# Patient Record
Sex: Female | Born: 1965 | Race: White | Hispanic: No | Marital: Married | Smoking: Current every day smoker
Health system: Southern US, Community
[De-identification: ages and names within clinical notes are randomized; demographics above are authoritative.]

## PROBLEM LIST (undated history)

## (undated) DIAGNOSIS — D649 Anemia, unspecified: Secondary | ICD-10-CM

## (undated) HISTORY — PX: COLONOSCOPY: SHX174

## (undated) HISTORY — DX: Anemia, unspecified: D64.9

---

## 2008-05-22 ENCOUNTER — Encounter (INDEPENDENT_AMBULATORY_CARE_PROVIDER_SITE_OTHER): Payer: Self-pay | Admitting: *Deleted

## 2009-04-18 ENCOUNTER — Encounter (INDEPENDENT_AMBULATORY_CARE_PROVIDER_SITE_OTHER): Payer: Self-pay | Admitting: *Deleted

## 2009-04-18 ENCOUNTER — Telehealth: Payer: Self-pay | Admitting: Internal Medicine

## 2009-05-15 ENCOUNTER — Encounter (INDEPENDENT_AMBULATORY_CARE_PROVIDER_SITE_OTHER): Payer: Self-pay | Admitting: *Deleted

## 2009-05-16 ENCOUNTER — Ambulatory Visit: Payer: Self-pay | Admitting: Internal Medicine

## 2009-05-28 ENCOUNTER — Ambulatory Visit: Payer: Self-pay | Admitting: Internal Medicine

## 2010-04-01 NOTE — Letter (Signed)
Summary: [facility] Instructions  [facility]  [address] [place], [place] [address]   Phone: [phone]  Fax: [phone]       Allison Carpenter    06/24/1965    MRN: [id number]        Procedure Day /Date:  Tuesday 05/28/2009     Arrival Time:  1:30 pm      Procedure Time: 2:30 pm     Location of Procedure:                    _ x_  [facility] (4th Floor)                        PREPARATION FOR COLONOSCOPY WITH MOVIPREP   Starting 5 days prior to your procedure Thursday 3/24 do not eat nuts, seeds, popcorn, corn, beans, peas,  salads, or any raw vegetables.  Do not take any fiber supplements (e.g. Metamucil, Citrucel, and Benefiber).  THE DAY BEFORE YOUR PROCEDURE         DATE: Monday 3/28  1.  Drink clear liquids the entire day-NO SOLID FOOD  2.  Do not drink anything colored red or purple.  Avoid juices with pulp.  No orange juice.  3.  Drink at least 64 oz. (8 glasses) of fluid/clear liquids during the day to prevent dehydration and help the prep work efficiently.  CLEAR LIQUIDS INCLUDE: Water Jello Ice Popsicles Tea (sugar ok, no milk/cream) Powdered fruit flavored drinks Coffee (sugar ok, no milk/cream) Gatorade Juice: apple, white grape, white cranberry  Lemonade Clear bullion, consomm, broth Carbonated beverages (any kind) Strained chicken noodle soup Hard Candy                             4.  In the morning, mix first dose of MoviPrep solution:    Empty 1 Pouch A and 1 Pouch B into the disposable container    Add lukewarm drinking water to the top line of the container. Mix to dissolve    Refrigerate (mixed solution should be used within 24 hrs)  5.  Begin drinking the prep at 5:00 p.m. The MoviPrep container is divided by 4 marks.   Every 15 minutes drink the solution down to the next mark (approximately 8 oz) until the full liter is complete.   6.  Follow completed prep with 16 oz of clear liquid of your choice (Nothing  red or purple).  Continue to drink clear liquids until bedtime.  7.  Before going to bed, mix second dose of MoviPrep solution:    Empty 1 Pouch A and 1 Pouch B into the disposable container    Add lukewarm drinking water to the top line of the container. Mix to dissolve    Refrigerate  THE DAY OF YOUR PROCEDURE      DATE: Tuesday 3/29  Beginning at 9:30 a.m. (5 hours before procedure):         1. Every 15 minutes, drink the solution down to the next mark (approx 8 oz) until the full liter is complete.  2. Follow completed prep with 16 oz. of clear liquid of your choice.    3. You may drink clear liquids until 12:30 pm (2 HOURS BEFORE PROCEDURE).   MEDICATION INSTRUCTIONS  Unless otherwise instructed, you should take regular prescription medications with a small sip of water   as early as possible the morning  of your procedure.           OTHER INSTRUCTIONS  You will need a responsible adult at least 44 years of age to accompany you and drive you home.   This person must remain in the waiting room during your procedure.  Wear loose fitting clothing that is easily removed.  Leave jewelry and other valuables at home.  However, you may wish to bring a book to read or  an iPod/MP3 player to listen to music as you wait for your procedure to start.  Remove all body piercing jewelry and leave at home.  Total time from sign-in until discharge is approximately 2-3 hours.  You should go home directly after your procedure and rest.  You can resume normal activities the  day after your procedure.  The day of your procedure you should not:   Drive   Make legal decisions   Operate machinery   Drink alcohol   Return to work  You will receive specific instructions about eating, activities and medications before you leave.    The above instructions have been reviewed and explained to me by   Ezra Sites RN  May 16, 2009 8:42 AM     I fully understand and can  verbalize these instructions _____________________________ Date _________

## 2010-04-01 NOTE — Miscellaneous (Signed)
Summary: [facility]  Clinical Lists Changes  Medications: Added new medication of MOVIPREP 100 GM  SOLR (PEG-KCL-NACL-NASULF-NA ASC-C) As per prep instructions. - Signed Rx of MOVIPREP 100 GM  SOLR (PEG-KCL-NACL-NASULF-NA ASC-C) As per prep instructions.;  #1 x 0;  Signed;  Entered by: Ezra Sites RN;  Authorized by: Hilarie Fredrickson MD;  Method used: Electronically to [facility]. [phone]*, [address], [place], [place], [place]  [address], Ph: [phone] or [phone], Fax: [phone] Observations: Added new observation of NKA: T (05/16/2009 8:21)    Prescriptions: MOVIPREP 100 GM  SOLR (PEG-KCL-NACL-NASULF-NA ASC-C) As per prep instructions.  #1 x 0   Entered by:   Ezra Sites RN   Authorized by:   Hilarie Fredrickson MD   Signed by:   Ezra Sites RN on 05/16/2009   Method used:   Electronically to        [facility] [phone]* (retail)       [address]       [place], [place]  [address]       Ph: [phone] or [phone]       Fax: [phone]   RxID:   [id number]

## 2010-04-01 NOTE — Progress Notes (Signed)
Summary: Schedule Colonoscopy  Phone Note Outgoing Call Call back at Home Phone [phone]   Call placed by: Harlow Mares CMA Duncan Dull),  April 18, 2009 2:50 PM Call placed to: Patient Summary of Call: No Answer Initial call taken by: Harlow Mares CMA Duncan Dull),  April 18, 2009 2:50 PM  Follow-up for Phone Call        scheduled colonoscopy Follow-up by: Harlow Mares CMA Duncan Dull),  April 18, 2009 3:07 PM

## 2010-04-01 NOTE — Letter (Signed)
Summary: Previsit letter  [facility]  [address] [place], [place] [address]   Phone: [phone]  Fax: [phone]       04/18/2009 MRN: [id number]  Allison Carpenter [address] [place], [place]  [address]  [place]  Dear [name],  Welcome to the Gastroenterology Division at [facility].    You are scheduled to see a nurse for your pre-procedure visit on 05/16/2009 at 8:30AM on the 3rd floor at [facility], [address]  We ask that you try to arrive at our office 15 minutes prior to your appointment time to allow for check-in.  Your nurse visit will consist of discussing your medical and surgical history, your immediate family medical history, and your medications.    Please bring a complete list of all your medications or, if you prefer, bring the medication bottles and we will list them.  We will need to be aware of both prescribed and over the counter drugs.  We will need to know exact dosage information as well.  If you are on blood thinners (Coumadin, Plavix, Aggrenox, Ticlid, etc.) please call our office today/prior to your appointment, as we need to consult with your physician about holding your medication.   Please be prepared to read and sign documents such as consent forms, a financial agreement, and acknowledgement forms.  If necessary, and with your consent, a friend or relative is welcome to sit-in on the nurse visit with you.  Please bring your insurance card so that we may make a copy of it.  If your insurance requires a referral to see a specialist, please bring your referral form from your primary care physician.  No co-pay is required for this nurse visit.     If you cannot keep your appointment, please call [phone] to cancel or reschedule prior to your appointment date.  This allows [place] the opportunity to schedule an appointment for another patient in need of care.    Thank you for choosing [facility] for your medical needs.   We appreciate the opportunity to care for you.  Please visit [place] at our website  to learn more about our practice.                     Sincerely.                                                                                                                   The Gastroenterology Division

## 2010-04-01 NOTE — Procedures (Signed)
Summary: Colonoscopy  Patient: Allison Carpenter Note: All result statuses are Final unless otherwise noted.  Tests: (1) Colonoscopy (COL)   COL Colonoscopy           DONE     [facility]     [address]     [place], [place]  [address]           COLONOSCOPY PROCEDURE REPORT           PATIENT:  Allison, Carpenter  MR#:  [id number]     BIRTHDATE:  06/26/1965, 44 yrs. old  GENDER:  female     ENDOSCOPIST:  Wilhemina Bonito. Eda Keys, MD     REF. BY:  Screening Recall     PROCEDURE DATE:  05/28/2009     PROCEDURE:  Higher-risk screening colonoscopy G0105           ASA CLASS:  Class I     INDICATIONS:  family history of colon cancer ; parent 74     (colonoscopy 06-2003 w/ diminutive polyp w/o path)     MEDICATIONS:   Fentanyl 100 mcg IV, Versed 10 mg IV           DESCRIPTION OF PROCEDURE:   After the risks benefits and     alternatives of the procedure were thoroughly explained, informed     consent was obtained.  Digital rectal exam was performed and     revealed no abnormalities.   The LB [id number] endoscope     was introduced through the anus and advanced to the cecum, which     was identified by both the appendix and ileocecal valve, without     limitations. Time to cecum = 3:47min.The quality of the prep was     excellent, using MoviPrep.  The instrument was then slowly     withdrawn (time =10:76min) as the colon was fully examined.     <<PROCEDUREIMAGES>>           FINDINGS:  A normal appearing cecum, ileocecal valve, and     appendiceal orifice were identified. The ascending, hepatic     flexure, transverse, splenic flexure, descending, sigmoid colon,     and rectum appeared unremarkable.  No polyps or cancers were seen.     Retroflexed views in the rectum revealed no abnormalities.    The     scope was then withdrawn from the patient and the procedure     completed.           COMPLICATIONS:  None     ENDOSCOPIC IMPRESSION:     1) Normal colon     2) No polyps or  cancers     3) Family history of colon cancer           RECOMMENDATIONS:     1) Follow up colonoscopy in 5 years           ______________________________     Wilhemina Bonito. Eda Keys, MD           CC:  Rudi Heap, MD;The Patient           n.     Rosalie DoctorWilhemina Bonito. Eda Keys at 05/28/2009 03:46 PM           Grine, Pamelia Hoit, [phone]  Note: An exclamation mark (!) indicates a result that was not dispersed into the flowsheet. Document Creation Date: 05/28/2009 3:47 PM _______________________________________________________________________  (1) Order result status: Final Collection or observation date-time: 05/28/2009 15:39  Requested date-time:  Receipt date-time:  Reported date-time:  Referring Physician:   Ordering Physician: Fransico Setters [phone]) Specimen Source:  Source: Launa Grill Order Number: [id number] Lab site:   Appended Document: Colonoscopy    Clinical Lists Changes  Observations: Added new observation of COLONNXTDUE: 05/2014 (05/28/2009 16:07)

## 2010-11-21 ENCOUNTER — Encounter: Payer: Self-pay | Admitting: *Deleted

## 2010-11-21 ENCOUNTER — Emergency Department (HOSPITAL_COMMUNITY)
Admission: EM | Admit: 2010-11-21 | Discharge: 2010-11-21 | Disposition: A | Discharge status: Home / Self Care | Payer: BC Managed Care – PPO | Attending: Emergency Medicine | Admitting: Emergency Medicine

## 2010-11-21 DIAGNOSIS — H9202 Otalgia, left ear: Secondary | ICD-10-CM

## 2010-11-21 DIAGNOSIS — H9209 Otalgia, unspecified ear: Secondary | ICD-10-CM | POA: Insufficient documentation

## 2010-11-21 DIAGNOSIS — F17200 Nicotine dependence, unspecified, uncomplicated: Secondary | ICD-10-CM | POA: Insufficient documentation

## 2010-11-21 MED ORDER — MORPHINE SULFATE 10 MG/ML IJ SOLN
8.0000 mg | Freq: Once | INTRAMUSCULAR | Status: AC
  Administered 2010-11-21: 10 mg via INTRAMUSCULAR
  Filled 2010-11-21: qty 1

## 2010-11-21 MED ORDER — PSEUDOEPHEDRINE HCL 60 MG PO TABS
60.0000 mg | ORAL_TABLET | Freq: Once | ORAL | Status: AC
  Administered 2010-11-21: 60 mg via ORAL
  Filled 2010-11-21: qty 1

## 2010-11-21 MED ORDER — HYDROCODONE-ACETAMINOPHEN 5-325 MG PO TABS: ORAL_TABLET | ORAL | Status: DC

## 2010-11-21 MED ORDER — DEXAMETHASONE SODIUM PHOSPHATE 4 MG/ML IJ SOLN
8.0000 mg | Freq: Once | INTRAMUSCULAR | Status: AC
  Administered 2010-11-21: 8 mg via INTRAMUSCULAR
  Filled 2010-11-21: qty 2

## 2010-11-21 MED ORDER — FEXOFENADINE-PSEUDOEPHED ER 60-120 MG PO TB12: 1.0000 | ORAL_TABLET | Freq: Two times a day (BID) | ORAL | Status: AC

## 2010-11-21 MED ORDER — DEXAMETHASONE 6 MG PO TABS: 6.0000 mg | ORAL_TABLET | Freq: Two times a day (BID) | ORAL | Status: AC

## 2010-11-21 NOTE — ED Notes (Signed)
Pt c/o left ear pain; pt states she went to her PCP and was given medicine with no relief; pt states it feels like her ear is about to bust

## 2010-11-21 NOTE — ED Notes (Signed)
MD at bedside. 

## 2010-11-21 NOTE — ED Provider Notes (Signed)
History     CSN: [id number] Arrival date & time: 11/21/2010  7:41 PM  Chief Complaint  Patient presents with  . Otalgia    left ear    HPI  (Consider location/radiation/quality/duration/timing/severity/associated sxs/prior treatment)  HPI Comments: Pt states over a week ago she noted sinus pain and ear pain. She was seen by ENT, was treated with 2 different antibiotics. She continues to have pain and pressure of the left ear. No drainage.  Patient is a 45 y.o. female presenting with ear pain. The history is provided by the patient.  Otalgia This is a new problem. There is pain in the left ear. The problem occurs constantly. The problem has been gradually worsening. The pain is at a severity of 10/10. The pain is severe. Associated symptoms include headaches. Pertinent negatives include no hearing loss, no sore throat, no abdominal pain, no vomiting, no neck pain, no cough and no rash.    History reviewed. No pertinent past medical history.  History reviewed. No pertinent past surgical history.  History reviewed. No pertinent family history.  History  Substance Use Topics  . Smoking status: Current Everyday Smoker  . Smokeless tobacco: Not on file  . Alcohol Use: No    OB History    Grav Para Term Preterm Abortions TAB SAB Ect Mult Living                  Review of Systems  Review of Systems  Constitutional: Negative for activity change.       All ROS Neg except as noted in HPI  HENT: Positive for ear pain. Negative for hearing loss, nosebleeds, sore throat and neck pain.   Eyes: Negative for photophobia and discharge.  Respiratory: Negative for cough, shortness of breath and wheezing.   Cardiovascular: Negative for chest pain and palpitations.  Gastrointestinal: Negative for vomiting, abdominal pain and blood in stool.  Genitourinary: Negative for dysuria, frequency and hematuria.  Musculoskeletal: Negative for back pain and arthralgias.  Skin: Negative.  Negative  for rash.  Neurological: Positive for headaches. Negative for dizziness, seizures and speech difficulty.  Psychiatric/Behavioral: Negative for hallucinations and confusion.    Allergies  Review of patient's allergies indicates no known allergies.  Home Medications   Current Outpatient Rx  Name Route Sig Dispense Refill  . LEVOFLOXACIN 750 MG PO TABS Oral Take 750 mg by mouth daily.      Marland Kitchen NAPROXEN SODIUM 220 MG PO TABS Oral Take 440 mg by mouth as needed. For headache pain     . OVER THE COUNTER MEDICATION Left Ear Place 2-3 drops into the left ear as needed. Peroxide &  Sweet Oil: for earache     . DEXAMETHASONE 6 MG PO TABS Oral Take 1 tablet (6 mg total) by mouth 2 (two) times daily with a meal. 12 tablet 0  . FEXOFENADINE-PSEUDOEPHEDRINE 60-120 MG PO TB12 Oral Take 1 tablet by mouth every 12 (twelve) hours. 20 tablet 0  . HYDROCODONE-ACETAMINOPHEN 5-325 MG PO TABS  1 or 2 po q4h prn pain 20 tablet 0    Physical Exam    BP 100/67  Pulse 102  Temp(Src) 99.8 F (37.7 C) (Oral)  Resp 20  Ht 5\' 7"  (1.702 m)  Wt 125 lb (56.7 kg)  BMI 19.58 kg/m2  SpO2 97%  Physical Exam  Nursing note and vitals reviewed. Constitutional: She is oriented to person, place, and time. She appears well-developed and well-nourished.  Non-toxic appearance.  HENT:  Head: Normocephalic.  Right Ear: Tympanic membrane and external ear normal.  Left Ear: Tympanic membrane normal.       Left TM swollen, but not red. No drainage. No palpable nodes present. Nasal congestion noted. Mild increase redness of the throat and mild swelling of the uvula.  Eyes: EOM and lids are normal. Pupils are equal, round, and reactive to light.  Neck: Normal range of motion. Neck supple. Carotid bruit is not present.  Cardiovascular: Normal rate, regular rhythm, normal heart sounds, intact distal pulses and normal pulses.   Pulmonary/Chest: Breath sounds normal. No respiratory distress.  Abdominal: Soft. Bowel sounds are  normal. There is no tenderness. There is no guarding.  Musculoskeletal: Normal range of motion.  Lymphadenopathy:       Head (right side): No submandibular adenopathy present.       Head (left side): No submandibular adenopathy present.    She has no cervical adenopathy.  Neurological: She is alert and oriented to person, place, and time. She has normal strength. No cranial nerve deficit or sensory deficit.  Skin: Skin is warm and dry.  Psychiatric: She has a normal mood and affect. Her speech is normal.    ED Course:Discussed finishing the current antibiotic and adding decadron, and norco. Pt to followup with MD on Monday.  Procedures (including critical care time)  Labs Reviewed - No data to display No results found.   1. Otalgia of left ear      MDM I have reviewed nursing notes, vital signs, and all appropriate lab and imaging results for this patient.        Kathie Dike, [place] 11/21/10 2124

## 2010-12-19 NOTE — ED Provider Notes (Signed)
Evaluation and management procedures were performed by the PA/NP under my supervision/collaboration.    Davone Shinault D Josip Merolla, MD 12/19/10 1927 

## 2011-11-26 ENCOUNTER — Ambulatory Visit (INDEPENDENT_AMBULATORY_CARE_PROVIDER_SITE_OTHER): Payer: BC Managed Care – PPO | Admitting: Otolaryngology

## 2011-11-26 DIAGNOSIS — H7200 Central perforation of tympanic membrane, unspecified ear: Secondary | ICD-10-CM

## 2011-11-26 DIAGNOSIS — H6120 Impacted cerumen, unspecified ear: Secondary | ICD-10-CM

## 2011-11-26 DIAGNOSIS — H6980 Other specified disorders of Eustachian tube, unspecified ear: Secondary | ICD-10-CM

## 2013-01-06 ENCOUNTER — Ambulatory Visit (INDEPENDENT_AMBULATORY_CARE_PROVIDER_SITE_OTHER): Payer: BC Managed Care – PPO | Admitting: Family Medicine

## 2013-01-06 ENCOUNTER — Encounter: Payer: Self-pay | Admitting: Family Medicine

## 2013-01-06 VITALS — BP 100/60 | HR 68 | Temp 98.4°F | Resp 18 | Ht 65.0 in | Wt 130.0 lb

## 2013-01-06 DIAGNOSIS — F438 Other reactions to severe stress: Secondary | ICD-10-CM

## 2013-01-06 DIAGNOSIS — G4700 Insomnia, unspecified: Secondary | ICD-10-CM

## 2013-01-06 DIAGNOSIS — F4329 Adjustment disorder with other symptoms: Secondary | ICD-10-CM

## 2013-01-06 DIAGNOSIS — F4389 Other reactions to severe stress: Secondary | ICD-10-CM

## 2013-01-06 MED ORDER — TRAZODONE HCL 50 MG PO TABS: 50.0000 mg | ORAL_TABLET | Freq: Every evening | ORAL | Status: DC | PRN

## 2013-01-06 NOTE — Patient Instructions (Signed)
FMLA paperwork needs to be faxed to our office Try trazodone as needed for sleep F/U 4 weeks for mood and work

## 2013-01-08 ENCOUNTER — Encounter: Payer: Self-pay | Admitting: Family Medicine

## 2013-01-08 DIAGNOSIS — G4700 Insomnia, unspecified: Secondary | ICD-10-CM | POA: Insufficient documentation

## 2013-01-08 DIAGNOSIS — F4329 Adjustment disorder with other symptoms: Secondary | ICD-10-CM | POA: Insufficient documentation

## 2013-01-08 HISTORY — DX: Adjustment disorder with other symptoms: F43.29

## 2013-01-08 HISTORY — DX: Insomnia, unspecified: G47.00

## 2013-01-08 NOTE — Assessment & Plan Note (Signed)
Trial of trazodone. 

## 2013-01-08 NOTE — Progress Notes (Signed)
  Subjective:    Patient ID: Allison Carpenter, female    DOB: 1966-01-11, 47 y.o.   MRN: [id number]  HPI  Pt here because of stress and insomnia. She works as a Production designer, theatre/television/film at [employer]. For the past few months she has had increased stress at work. She feels she needs a time out and that her plate is too full.She has too much to juggle and her health is suffering for it. She cant seem to turn off all of her tasks and feels it is straining her. She is very edge and snappy at home which is not her typical. She denies feeling of depression. She is not sleeping well, difficulty falling asleep and staying asleep, which makes her fatigued during the day.  Denies use of illicit susbtances.  No history of GAD or Depression, no chronic medical problems.  Review of Systems  GEN- + fatigue, fever, weight loss,weakness, recent illness HEENT- denies eye drainage, change in vision, nasal discharge, CVS- denies chest pain, palpitations RESP- denies SOB, cough, wheeze ABD- denies N/V, change in stools, abd pain GU- denies dysuria, hematuria, dribbling, incontinence MSK- denies joint pain, muscle aches, injury Neuro- denies headache, dizziness, syncope, seizure activity      Objective:   Physical Exam GEN- NAD, alert and oriented x3 HEENT- PERRL, EOMI, non injected sclera, pink conjunctiva, MMM, oropharynx clear CVS- RRR, no murmur RESP-CTAB EXT- No edema Pulses- Radial 2+ Psych- Stressed appearing, well groomed, not depressed appearing, no response to internal stimuli, no SI        Assessment & Plan:

## 2013-01-08 NOTE — Assessment & Plan Note (Addendum)
She presents at at time where she is still functioning but overall productivty has decreased She would benefit from time out of work as a mental break- FMLA will be completed RTC 4 weeks for f/u Discussed stress relievers, regular exercise, massage therapy ,reading

## 2013-02-03 ENCOUNTER — Ambulatory Visit (INDEPENDENT_AMBULATORY_CARE_PROVIDER_SITE_OTHER): Payer: BC Managed Care – PPO | Admitting: Family Medicine

## 2013-02-03 VITALS — BP 100/70 | HR 82 | Temp 98.3°F | Resp 18 | Ht 65.5 in | Wt 133.0 lb

## 2013-02-03 DIAGNOSIS — G4700 Insomnia, unspecified: Secondary | ICD-10-CM

## 2013-02-03 DIAGNOSIS — F4389 Other reactions to severe stress: Secondary | ICD-10-CM

## 2013-02-03 DIAGNOSIS — F4329 Adjustment disorder with other symptoms: Secondary | ICD-10-CM

## 2013-02-03 DIAGNOSIS — F438 Other reactions to severe stress: Secondary | ICD-10-CM

## 2013-02-03 NOTE — Progress Notes (Signed)
   Subjective:    Patient ID: Allison Carpenter, female    DOB: 02-17-1966, 47 y.o.   MRN: [id number]  HPI  Pt here to f/u FMLA and stress. She's taken out of work for the past 4 weeks. Her sleep has improved some with decreased stress from the day-to-day grind. She is using trazodone as needed. She feels she is ready to return to work. She has no specific concerns today. She will schedule physical exam including Pap smear   Review of Systems  GEN- denies fatigue, fever, weight loss,weakness, recent illness HEENT- denies eye drainage, change in vision, nasal discharge, CVS- denies chest pain, palpitations RESP- denies SOB, cough, wheeze Neuro- denies headache, dizziness, syncope, seizure activity      Objective:   Physical Exam GEN-NAD,alert and oriented x 3 Psych- normal affect and mood, happy mood, smiling       Assessment & Plan:

## 2013-02-03 NOTE — Patient Instructions (Addendum)
Continue current medications  Schedule a physical Exam with PAP Smear -- End of January

## 2013-02-05 ENCOUNTER — Encounter: Payer: Self-pay | Admitting: Family Medicine

## 2013-02-05 NOTE — Assessment & Plan Note (Signed)
Continue trazodone as needed. 

## 2013-02-05 NOTE — Assessment & Plan Note (Signed)
This was much needed time off from her busy schedule. She feels refreshed after the 4 week time off. She will return to work with no restrictions

## 2013-03-29 ENCOUNTER — Other Ambulatory Visit: Payer: BC Managed Care – PPO | Admitting: Family Medicine

## 2013-11-04 ENCOUNTER — Encounter: Payer: Self-pay | Admitting: Internal Medicine

## 2014-06-06 ENCOUNTER — Encounter: Payer: Self-pay | Admitting: Internal Medicine

## 2014-07-02 ENCOUNTER — Encounter: Payer: Self-pay | Admitting: Internal Medicine

## 2014-08-28 ENCOUNTER — Ambulatory Visit (AMBULATORY_SURGERY_CENTER): Payer: Self-pay | Admitting: *Deleted

## 2014-08-28 VITALS — Ht 68.0 in | Wt 124.6 lb

## 2014-08-28 DIAGNOSIS — Z800 Family history of malignant neoplasm of digestive organs: Secondary | ICD-10-CM

## 2014-08-28 NOTE — Progress Notes (Signed)
No egg or soy allergy  Never been intubated, no trouble moving neck  No diet medications taken  Registered in [facility]

## 2014-09-11 ENCOUNTER — Encounter: Payer: Self-pay | Admitting: Internal Medicine

## 2014-09-11 ENCOUNTER — Ambulatory Visit (AMBULATORY_SURGERY_CENTER): Payer: BLUE CROSS/BLUE SHIELD | Admitting: Internal Medicine

## 2014-09-11 VITALS — BP 96/56 | HR 66 | Temp 97.2°F | Resp 28 | Ht 68.0 in | Wt 124.0 lb

## 2014-09-11 DIAGNOSIS — Z800 Family history of malignant neoplasm of digestive organs: Secondary | ICD-10-CM | POA: Diagnosis present

## 2014-09-11 DIAGNOSIS — Z1211 Encounter for screening for malignant neoplasm of colon: Secondary | ICD-10-CM | POA: Diagnosis not present

## 2014-09-11 MED ORDER — SODIUM CHLORIDE 0.9 % IV SOLN: 500.0000 mL | INTRAVENOUS | Status: DC

## 2014-09-11 NOTE — Progress Notes (Signed)
A/ox3 pleased with MAC, report to Celia RN 

## 2014-09-11 NOTE — Op Note (Signed)
[facility] [place] [facility]  [address]. [place], [address]   COLONOSCOPY PROCEDURE REPORT  PATIENT: Allison Carpenter, Allison Carpenter  MR#: [id number] BIRTHDATE: 11/21/1965 , 49  yrs. old GENDER: female ENDOSCOPIST: Eustace Quail, MD REFERRED [id number] Recall, PROCEDURE DATE:  09/11/2014 PROCEDURE:   Colonoscopy, screening First Screening Colonoscopy - Avg.  risk and is 50 yrs.  old or older - No.  Prior Negative Screening - Now for repeat screening. Less than 10 yrs Prior Negative Screening - Now for repeat screening.  Above average risk  History of Adenoma - Now for follow-up colonoscopy & has been > or = to 3 yrs.  N/A  Polyps removed today? No Recommend repeat exam, <10 yrs? No ASA CLASS:   Class I INDICATIONS:Screening for colonic neoplasia and FH Colon or Rectal Adenocarcinoma... With colon cancer age 19. Prior examinations 2005 and 2011 negative for neoplasia MEDICATIONS: Monitored anesthesia care and Propofol 180 mg IV  DESCRIPTION OF PROCEDURE:   After the risks benefits and alternatives of the procedure were thoroughly explained, informed consent was obtained.  The digital rectal exam revealed no abnormalities of the rectum.   The LB [id number]  endoscope was introduced through the anus and advanced to the cecum, which was identified by both the appendix and ileocecal valve. No adverse events experienced.   The quality of the prep was excellent. (MoviPrep was used)  The instrument was then slowly withdrawn as the colon was fully examined. Estimated blood loss is zero unless otherwise noted in this procedure report.      COLON FINDINGS: A normal appearing cecum, ileocecal valve, and appendiceal orifice were identified.  The ascending, transverse, descending, sigmoid colon, and rectum appeared unremarkable. Retroflexed views revealed no abnormalities. The time to cecum = 4.3 Withdrawal time = 8.4   The scope was withdrawn and the procedure completed. COMPLICATIONS:  There were no immediate complications.  ENDOSCOPIC IMPRESSION: 1. Normal colonoscopy  RECOMMENDATIONS: 1. Follow up colonoscopy in 5 years (family history)  eSigned:  Eustace Quail, MD 09/11/2014 11:13 AM   cc: The Patient and Vic Blackbird MD

## 2014-09-11 NOTE — Patient Instructions (Signed)
Discharge instructions given. Normal exam. Resume previous medications. YOU HAD AN ENDOSCOPIC PROCEDURE TODAY AT THE [facility]:   Refer to the procedure report that was given to you for any specific questions about what was found during the examination.  If the procedure report does not answer your questions, please call your gastroenterologist to clarify.  If you requested that your care partner not be given the details of your procedure findings, then the procedure report has been included in a sealed envelope for you to review at your convenience later.  YOU SHOULD EXPECT: Some feelings of bloating in the abdomen. Passage of more gas than usual.  Walking can help get rid of the air that was put into your GI tract during the procedure and reduce the bloating. If you had a lower endoscopy (such as a colonoscopy or flexible sigmoidoscopy) you may notice spotting of blood in your stool or on the toilet paper. If you underwent a bowel prep for your procedure, you may not have a normal bowel movement for a few days.  Please Note:  You might notice some irritation and congestion in your nose or some drainage.  This is from the oxygen used during your procedure.  There is no need for concern and it should clear up in a day or so.  SYMPTOMS TO REPORT IMMEDIATELY:   Following lower endoscopy (colonoscopy or flexible sigmoidoscopy):  Excessive amounts of blood in the stool  Significant tenderness or worsening of abdominal pains  Swelling of the abdomen that is new, acute  Fever of 100F or higher   For urgent or emergent issues, a gastroenterologist can be reached at any hour by calling [phone].   DIET: Your first meal following the procedure should be a small meal and then it is ok to progress to your normal diet. Heavy or fried foods are harder to digest and may make you feel nauseous or bloated.  Likewise, meals heavy in dairy and vegetables can increase bloating.  Drink plenty  of fluids but you should avoid alcoholic beverages for 24 hours.  ACTIVITY:  You should plan to take it easy for the rest of today and you should NOT DRIVE or use heavy machinery until tomorrow (because of the sedation medicines used during the test).    FOLLOW UP: Our staff will call the number listed on your records the next business day following your procedure to check on you and address any questions or concerns that you may have regarding the information given to you following your procedure. If we do not reach you, we will leave a message.  However, if you are feeling well and you are not experiencing any problems, there is no need to return our call.  We will assume that you have returned to your regular daily activities without incident.  If any biopsies were taken you will be contacted by phone or by letter within the next 1-3 weeks.  Please call us at [phone] if you have not heard about the biopsies in 3 weeks.    SIGNATURES/CONFIDENTIALITY: You and/or your care partner have signed paperwork which will be entered into your electronic medical record.  These signatures attest to the fact that that the information above on your After Visit Summary has been reviewed and is understood.  Full responsibility of the confidentiality of this discharge information lies with you and/or your care-partner. 

## 2014-09-12 ENCOUNTER — Telehealth: Payer: Self-pay

## 2014-09-12 NOTE — Telephone Encounter (Signed)
Left message on answering machine. 

## 2015-11-12 DIAGNOSIS — T162XXA Foreign body in left ear, initial encounter: Secondary | ICD-10-CM

## 2015-11-12 DIAGNOSIS — H6993 Unspecified Eustachian tube disorder, bilateral: Secondary | ICD-10-CM

## 2015-11-12 DIAGNOSIS — J342 Deviated nasal septum: Secondary | ICD-10-CM | POA: Insufficient documentation

## 2015-11-12 DIAGNOSIS — H6983 Other specified disorders of Eustachian tube, bilateral: Secondary | ICD-10-CM

## 2015-11-12 HISTORY — DX: Foreign body in left ear, initial encounter: T16.2XXA

## 2015-11-12 HISTORY — DX: Unspecified eustachian tube disorder, bilateral: H69.93

## 2015-11-12 HISTORY — DX: Other specified disorders of eustachian tube, bilateral: H69.83

## 2016-09-23 LAB — HM MAMMOGRAPHY

## 2016-11-27 ENCOUNTER — Encounter: Payer: Self-pay | Admitting: Family Medicine

## 2017-09-28 LAB — HM MAMMOGRAPHY

## 2017-10-05 ENCOUNTER — Encounter: Payer: Self-pay | Admitting: *Deleted

## 2018-07-21 ENCOUNTER — Encounter: Payer: Self-pay | Admitting: Orthopaedic Surgery

## 2018-07-21 ENCOUNTER — Ambulatory Visit (INDEPENDENT_AMBULATORY_CARE_PROVIDER_SITE_OTHER): Payer: 59

## 2018-07-21 ENCOUNTER — Telehealth: Payer: Self-pay | Admitting: Orthopaedic Surgery

## 2018-07-21 ENCOUNTER — Ambulatory Visit (INDEPENDENT_AMBULATORY_CARE_PROVIDER_SITE_OTHER): Payer: 59 | Admitting: Orthopaedic Surgery

## 2018-07-21 ENCOUNTER — Ambulatory Visit
Admission: EM | Admit: 2018-07-21 | Discharge: 2018-07-21 | Disposition: A | Discharge status: Home / Self Care | Payer: 59 | Attending: Emergency Medicine | Admitting: Emergency Medicine

## 2018-07-21 ENCOUNTER — Other Ambulatory Visit: Payer: Self-pay

## 2018-07-21 VITALS — Ht 67.0 in | Wt 127.0 lb

## 2018-07-21 DIAGNOSIS — S92321D Displaced fracture of second metatarsal bone, right foot, subsequent encounter for fracture with routine healing: Secondary | ICD-10-CM

## 2018-07-21 DIAGNOSIS — S82831D Other fracture of upper and lower end of right fibula, subsequent encounter for closed fracture with routine healing: Secondary | ICD-10-CM | POA: Diagnosis not present

## 2018-07-21 DIAGNOSIS — S82839D Other fracture of upper and lower end of unspecified fibula, subsequent encounter for closed fracture with routine healing: Secondary | ICD-10-CM | POA: Insufficient documentation

## 2018-07-21 DIAGNOSIS — S82831G Other fracture of upper and lower end of right fibula, subsequent encounter for closed fracture with delayed healing: Secondary | ICD-10-CM

## 2018-07-21 DIAGNOSIS — W19XXXA Unspecified fall, initial encounter: Secondary | ICD-10-CM

## 2018-07-21 DIAGNOSIS — S82451A Displaced comminuted fracture of shaft of right fibula, initial encounter for closed fracture: Secondary | ICD-10-CM

## 2018-07-21 DIAGNOSIS — S92324A Nondisplaced fracture of second metatarsal bone, right foot, initial encounter for closed fracture: Secondary | ICD-10-CM | POA: Diagnosis not present

## 2018-07-21 DIAGNOSIS — S8264XA Nondisplaced fracture of lateral malleolus of right fibula, initial encounter for closed fracture: Secondary | ICD-10-CM | POA: Diagnosis not present

## 2018-07-21 HISTORY — DX: Displaced fracture of second metatarsal bone, right foot, subsequent encounter for fracture with routine healing: S92.321D

## 2018-07-21 HISTORY — DX: Other fracture of upper and lower end of unspecified fibula, subsequent encounter for closed fracture with routine healing: S82.839D

## 2018-07-21 MED ORDER — NAPROXEN 375 MG PO TABS: 375.0000 mg | ORAL_TABLET | Freq: Two times a day (BID) | ORAL | 0 refills | Status: DC

## 2018-07-21 MED ORDER — TRAMADOL HCL 50 MG PO TABS: 50.0000 mg | ORAL_TABLET | Freq: Two times a day (BID) | ORAL | 0 refills | Status: DC | PRN

## 2018-07-21 NOTE — Progress Notes (Signed)
Office Visit Note   Patient: Allison Carpenter           Date of Birth: 01-Dec-1965           MRN: [id number] Visit Date: 07/21/2018              Requested by: Alycia Rossetti, MD [address] [place], [place] [address] PCP: Alycia Rossetti, MD   Assessment & Plan: Visit Diagnoses:  1. Closed avulsion fracture of distal end of right fibula with routine healing, subsequent encounter   2. Closed displaced fracture of second metatarsal bone of right foot with routine healing, subsequent encounter     Plan: Return visit 4 weeks.  AP and oblique of her foot on return and also three-view x-rays right ankle.  Cam boot use discussed.  Elevation, ice intermittent.  She already has tramadol and Naprosyn.  Follow-Up Instructions: Return in about 4 weeks (around 08/18/2018).   Orders:  No orders of the defined types were placed in this encounter.  No orders of the defined types were placed in this encounter.     Procedures: No procedures performed   Clinical Data: No additional findings.   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  She injured her right foot when she fell down the steps at home on 06/26/18 and had ecchymosis black and blue lateral ankle pain And also pain over second metatarsal neck.  She continued to have problems her husband encouraged her to be seen and x-rays obtained at urgent care in [facility] showed second metatarsal neck fracture with maybe 1 mm displacement and Weber A distal fibular fracture transverse and nondisplaced. Review of Systems patient smokes 1 pack/day never had a bone density test.  She is used Naprosyn and tramadol for pain.  Positive for insomnia stress and adjustment reaction otherwise noncontributory to HPI.   14 point systems updated.   Objective: Vital Signs: Ht 5\' 7"  (1.702 m)    Wt 127 lb (57.6 kg)    BMI 19.89 kg/m   Physical Exam Constitutional:      Appearance: She is well-developed.  HENT:     Head: Normocephalic.     Right Ear: External ear normal.     Left Ear: External ear normal.  Eyes:     Pupils: Pupils are equal, round, and reactive to light.  Neck:     Thyroid: No thyromegaly.     Trachea: No tracheal deviation.  Cardiovascular:     Rate and Rhythm: Normal rate.  Pulmonary:     Effort: Pulmonary effort is normal.  Abdominal:     Palpations: Abdomen is soft.  Skin:    General: Skin is warm and dry.  Neurological:     Mental Status: She is alert and oriented to person, place, and time.  Psychiatric:        Behavior: Behavior normal.     Ortho Exam patient has tenderness over the second metatarsal head no ecchymosis no plantar foot lesions.  Tenderness over the distal fibula.  No medial tenderness over the deltoid.  Toe flexion extension is intact.  Good capillary refill normal pulses negative straight leg raising 90 degrees.  No ankle effusion.  EHL anterior tib is active and  takes good resistive testing.  Specialty Comments:  No specialty comments available.  Imaging: Dg Ankle Complete Right  Result Date: 07/21/2018 CLINICAL DATA:  Fall down stairs 3 weeks ago with persistent ankle pain, initial encounter EXAM: RIGHT ANKLE - COMPLETE 3+ VIEW COMPARISON:  None. FINDINGS: Transverse fracture through the distal tip fibula is noted with mild displacement. A small adjacent fracture is noted within the fracture line. Distal tibia is within normal limits. Tarsal bones are unremarkable. IMPRESSION: Mildly displaced comminuted distal right fibular fracture. Electronically Signed   By: Inez Catalina M.D.   On: 07/21/2018 11:31   Dg Foot Complete Right  Result Date: 07/21/2018 CLINICAL DATA:  Status post fall down the stairs EXAM: RIGHT FOOT COMPLETE - 3+ VIEW COMPARISON:   None. FINDINGS: Generalized osteopenia. Acute nondisplaced fracture of the right second metatarsal neck. Not well-visualized acute nondisplaced fracture of the lateral malleolus. No other fracture or dislocation. Overall alignment is anatomic. Soft tissue swelling along the dorsal aspect of the forefoot. IMPRESSION: Acute nondisplaced fracture of the right second metatarsal neck. Not well-visualized acute nondisplaced fracture of the lateral malleolus. Electronically Signed   By: Kathreen Devoid   On: 07/21/2018 11:25     PMFS History: Patient Active Problem List   Diagnosis Date Noted   Insomnia 01/08/2013   Stress and adjustment reaction 01/08/2013   Past Medical History:  Diagnosis Date   Anemia     Family History  Problem Relation Age of Onset   Cancer Mother 48       colon   Colon cancer Mother    Cancer Maternal Grandmother        colon   Stomach cancer Neg Hx    Rectal cancer Neg Hx    Esophageal cancer Neg Hx     Past Surgical History:  Procedure Laterality Date   COLONOSCOPY     Social History   Occupational History   Not on file  Tobacco Use   Smoking status: Current Every Day Smoker    Packs/day: 0.50   Smokeless tobacco: Never Used  Substance and Sexual Activity   Alcohol use: No   Drug use: No   Sexual activity: Yes    Birth control/protection: None

## 2018-07-21 NOTE — Telephone Encounter (Signed)
Call received from provider (PA) at [facility], [phone]; relays that patient was treated at their clinic today for foot and ankle injury/fractures. Requests appointment for today. Relayed to her that our provider has finished clinic for today. Our next available appointment with either provider (due to holiday) is Tuesday, 07/26/18. States she will try to contact another provider. Aware to call back if we can assist.

## 2018-07-21 NOTE — Discharge Instructions (Addendum)
X-rays did not show fracture or dislocation CAM walker placed Crutches given Please remain non-weight-bearing until evaluated by an orthopedist Continue conservative management of rest, ice, and elevation Take naproxen as needed for pain relief (may cause abdominal discomfort, ulcers, and GI bleeds avoid taking with other NSAIDs) Tramadol for severe break-through pain.  This medication may make you drowsy, so do not take this medication prior to driving or operating heavy machinery Appointment for [facility] in [place] at 1:00 o'clock today Return or go to the ER if you have any new or worsening symptoms (fever, chills, redness, bruising, swelling, chest pain, abdominal pain, changes in bowel or bladder habits, etc...)

## 2018-07-21 NOTE — ED Provider Notes (Signed)
[facility]   [id number] 07/21/18 Arrival Time: 9381  CC: RT ankle/ foot pain  SUBJECTIVE: History from: patient. Allison Carpenter is a 53 y.o. female complains of right ankle/ foot pain x 3 weeks.  Symptoms began after a slip and fall down 16 stairs.  Localizes the pain to the outside of ankle and top of foot.  Describes the pain as constant and throbbing in character.  9/10. Has tried elevation, and ice with minimal relief.  Symptoms are made worse with weight-bearing.  Denies similar symptoms in the past.  Complains of associated swelling, and ecchymosis, now resolved.  Denies fever, chills, erythema, weakness, numbness and tingling.    ROS: As per HPI.  Past Medical History:  Diagnosis Date  . Anemia    Past Surgical History:  Procedure Laterality Date  . COLONOSCOPY     No Known Allergies No current facility-administered medications on file prior to encounter.    No current outpatient medications on file prior to encounter.   Social History   Socioeconomic History  . Marital status: Married    Spouse name: Not on file  . Number of children: Not on file  . Years of education: Not on file  . Highest education level: Not on file  Occupational History  . Not on file  Social Needs  . Financial resource strain: Not on file  . Food insecurity:    Worry: Not on file    Inability: Not on file  . Transportation needs:    Medical: Not on file    Non-medical: Not on file  Tobacco Use  . Smoking status: Current Every Day Smoker    Packs/day: 0.50  . Smokeless tobacco: Never Used  Substance and Sexual Activity  . Alcohol use: No  . Drug use: No  . Sexual activity: Yes    Birth control/protection: None  Lifestyle  . Physical activity:    Days per week: Not on file    Minutes per session: Not on file  . Stress: Not on file  Relationships  . Social connections:    Talks on phone: Not on file    Gets together: Not on file    Attends religious service: Not on  file    Active member of club or organization: Not on file    Attends meetings of clubs or organizations: Not on file    Relationship status: Not on file  . Intimate partner violence:    Fear of current or ex partner: Not on file    Emotionally abused: Not on file    Physically abused: Not on file    Forced sexual activity: Not on file  Other Topics Concern  . Not on file  Social History Narrative  . Not on file   Family History  Problem Relation Age of Onset  . Cancer Mother 16       colon  . Colon cancer Mother   . Cancer Maternal Grandmother        colon  . Stomach cancer Neg Hx   . Rectal cancer Neg Hx   . Esophageal cancer Neg Hx     OBJECTIVE:  Vitals:   07/21/18 1038  BP: 100/71  Pulse: 88  Resp: 18  Temp: (!) 97.5 F (36.4 C)  SpO2: 96%    General appearance: Alert; in no acute distress.  Head: NCAT Lungs: Normal respiratory effort CV: dorsalis pedis pulse 2+ intact. Cap refill < 2 seconds Musculoskeletal: Right ankle/ foot Inspection: Moderate  swelling about the ankle Palpation: Diffusely TTP over lateral and medial malleoli; TTP over distal MTs digits 2-5 ROM: FROM active and passive Strength: 5/5 dorsiflexion, 5/5 plantar flexion Skin: warm and dry Neurologic: Ambulates with antalgic gait; Sensation intact about the upper/ lower extremities Psychological: alert and cooperative; normal mood and affect  DIAGNOSTIC STUDIES:  Dg Ankle Complete Right  Result Date: 07/21/2018 CLINICAL DATA:  Fall down stairs 3 weeks ago with persistent ankle pain, initial encounter EXAM: RIGHT ANKLE - COMPLETE 3+ VIEW COMPARISON:  None. FINDINGS: Transverse fracture through the distal tip fibula is noted with mild displacement. A small adjacent fracture is noted within the fracture line. Distal tibia is within normal limits. Tarsal bones are unremarkable. IMPRESSION: Mildly displaced comminuted distal right fibular fracture. Electronically Signed   By: Inez Catalina M.D.    On: 07/21/2018 11:31   Dg Foot Complete Right  Result Date: 07/21/2018 CLINICAL DATA:  Status post fall down the stairs EXAM: RIGHT FOOT COMPLETE - 3+ VIEW COMPARISON:  None. FINDINGS: Generalized osteopenia. Acute nondisplaced fracture of the right second metatarsal neck. Not well-visualized acute nondisplaced fracture of the lateral malleolus. No other fracture or dislocation. Overall alignment is anatomic. Soft tissue swelling along the dorsal aspect of the forefoot. IMPRESSION: Acute nondisplaced fracture of the right second metatarsal neck. Not well-visualized acute nondisplaced fracture of the lateral malleolus. Electronically Signed   By: Kathreen Devoid   On: 07/21/2018 11:25    ASSESSMENT & PLAN:  1. Closed fracture of distal end of right fibula with delayed healing, unspecified fracture morphology, subsequent encounter   2. Fall, initial encounter   3. Closed nondisplaced fracture of second metatarsal bone of right foot, initial encounter    Meds ordered this encounter  Medications  . naproxen (NAPROSYN) 375 MG tablet    Sig: Take 1 tablet (375 mg total) by mouth 2 (two) times daily.    Dispense:  20 tablet    Refill:  0    Order Specific Question:   Supervising Provider    Answer:   Raylene Everts [[id number]]  . traMADol (ULTRAM) 50 MG tablet    Sig: Take 1 tablet (50 mg total) by mouth every 12 (twelve) hours as needed for severe pain.    Dispense:  10 tablet    Refill:  0    Order Specific Question:   Supervising Provider    Answer:   Raylene Everts [[id number]]   X-rays showed distal fibula fracture and second metatarsal fracture CAM walker placed Crutches given Please remain non-weight-bearing until evaluated by an orthopedist Continue conservative management of rest, ice, and elevation Take naproxen as needed for pain relief (may cause abdominal discomfort, ulcers, and GI bleeds avoid taking with other NSAIDs) Tramadol for severe break-through pain.  This medication  may make you drowsy, so do not take this medication prior to driving or operating heavy machinery Appointment for [facility] in [place] at 1:00 o'clock today Return or go to the ER if you have any new or worsening symptoms (fever, chills, redness, bruising, swelling, chest pain, abdominal pain, changes in bowel or bladder habits, etc...)   Reviewed expectations re: course of current medical issues. Questions answered. Outlined signs and symptoms indicating need for more acute intervention. Patient verbalized understanding. After Visit Summary given.    Lestine Box, PA-C 07/21/18 1211

## 2018-07-21 NOTE — ED Triage Notes (Signed)
Pt fell down 16 stairs 3 weeks ago and injured right ankle. Pt states that still painful to bear weight and burning to right foot , pt also has pain to right foot

## 2018-08-18 ENCOUNTER — Ambulatory Visit: Payer: Self-pay

## 2018-08-18 ENCOUNTER — Other Ambulatory Visit: Payer: Self-pay

## 2018-08-18 ENCOUNTER — Encounter: Payer: Self-pay | Admitting: Orthopaedic Surgery

## 2018-08-18 ENCOUNTER — Ambulatory Visit (INDEPENDENT_AMBULATORY_CARE_PROVIDER_SITE_OTHER): Payer: 59 | Admitting: Orthopaedic Surgery

## 2018-08-18 VITALS — Ht 67.0 in | Wt 127.0 lb

## 2018-08-18 DIAGNOSIS — S92321D Displaced fracture of second metatarsal bone, right foot, subsequent encounter for fracture with routine healing: Secondary | ICD-10-CM | POA: Diagnosis not present

## 2018-08-18 DIAGNOSIS — S82831D Other fracture of upper and lower end of right fibula, subsequent encounter for closed fracture with routine healing: Secondary | ICD-10-CM | POA: Diagnosis not present

## 2018-08-18 NOTE — Progress Notes (Signed)
Office Visit Note   Patient: Allison Carpenter           Date of Birth: 1966/02/08           MRN: [id number] Visit Date: 08/18/2018              Requested by: Alycia Rossetti, MD [address] [place],  [place] [address] PCP: Alycia Rossetti, MD   Assessment & Plan: Visit Diagnoses:  1. Closed avulsion fracture of distal end of right fibula with routine healing, subsequent encounter   2. Closed displaced fracture of second metatarsal bone of right foot with routine healing, subsequent encounter     Plan: Continue cam boot.  Recheck 6 weeks we can repeat x-rays of her ankle only if she is continued to have symptoms.  Follow-Up Instructions: No follow-ups on file.   Orders:  Orders Placed This Encounter  Procedures  . XR Foot 2 Views Right  . XR Ankle Complete Right   No orders of the defined types were placed in this encounter.     Procedures: No procedures performed   Clinical Data: No additional findings.   Subjective: Chief Complaint  Patient presents with  . Right Ankle - Fracture, Follow-up    DOI 06/26/2018  . Right Foot - Fracture, Follow-up    DOI 06/26/2018    HPI 53 year old female returns for follow-up of the ankle fracture and second metatarsal neck fracture on the right side.  Original date of injury was 06/26/2018.  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  She walks for about 45 minutes and then deck for early morning cleanup activities.  She still notes tenderness soreness she tends to leave her boot off at home.  She still has lateral ankle pain and when she walks also has pain over the second metatarsal.  Review of Systems updated unchanged from 521/20 office visit   Objective: Vital Signs: Ht 5\' 7"  (1.702 m)   Wt 127 lb (57.6 kg)   BMI 19.89 kg/m   Physical Exam Constitutional:      Appearance: She is well-developed.  HENT:     Head: Normocephalic.     Right Ear: External ear  normal.     Left Ear: External ear normal.  Eyes:     Pupils: Pupils are equal, round, and reactive to light.  Neck:     Thyroid: No thyromegaly.     Trachea: No tracheal deviation.  Cardiovascular:     Rate and Rhythm: Normal rate.  Pulmonary:     Effort: Pulmonary effort is normal.  Abdominal:     Palpations: Abdomen is soft.  Skin:    General: Skin is warm and dry.  Neurological:     Mental Status: She is alert and oriented to person, place, and time.  Psychiatric:        Behavior: Behavior normal.     Ortho Exam patient has some tenderness of the second metatarsal with slight prominence dorsally.  No plantar foot lesion.  She still tender over the distal fibula but I am unable to demonstrate any motion at the fracture site.  Specialty Comments:  No specialty comments available.  Imaging: No results found.   PMFS History: Patient Active Problem List   Diagnosis Date Noted  . Closed avulsion fracture of distal fibula with routine healing 07/21/2018  . Displaced fracture of second metatarsal bone of right foot with routine healing  07/21/2018  . Insomnia 01/08/2013  . Stress and adjustment reaction 01/08/2013   Past Medical History:  Diagnosis Date  . Anemia     Family History  Problem Relation Age of Onset  . Cancer Mother 6       colon  . Colon cancer Mother   . Cancer Maternal Grandmother        colon  . Stomach cancer Neg Hx   . Rectal cancer Neg Hx   . Esophageal cancer Neg Hx     Past Surgical History:  Procedure Laterality Date  . COLONOSCOPY     Social History   Occupational History  . Not on file  Tobacco Use  . Smoking status: Current Every Day Smoker    Packs/day: 0.50  . Smokeless tobacco: Never Used  Substance and Sexual Activity  . Alcohol use: No  . Drug use: No  . Sexual activity: Yes    Birth control/protection: None

## 2018-09-15 ENCOUNTER — Ambulatory Visit: Payer: 59 | Admitting: Family Medicine

## 2018-09-15 ENCOUNTER — Encounter: Payer: Self-pay | Admitting: Family Medicine

## 2018-09-15 ENCOUNTER — Other Ambulatory Visit: Payer: Self-pay

## 2018-09-15 VITALS — BP 110/72 | HR 86 | Temp 97.6°F | Resp 15 | Ht 67.0 in | Wt 128.1 lb

## 2018-09-15 DIAGNOSIS — Z789 Other specified health status: Secondary | ICD-10-CM | POA: Diagnosis not present

## 2018-09-15 DIAGNOSIS — L918 Other hypertrophic disorders of the skin: Secondary | ICD-10-CM | POA: Diagnosis not present

## 2018-09-15 DIAGNOSIS — Z9189 Other specified personal risk factors, not elsewhere classified: Secondary | ICD-10-CM | POA: Diagnosis not present

## 2018-09-15 DIAGNOSIS — Z720 Tobacco use: Secondary | ICD-10-CM

## 2018-09-15 NOTE — Progress Notes (Signed)
Subjective:     Patient ID: Allison Carpenter, female   DOB: 1965/03/29, 53 y.o.   MRN: [id number]  Allison Carpenter presents for Establish Care  Allison Carpenter is a 53 year old female patient who presents today because reports only known history of anemia issues.  Allison up with colonoscopy secondary to strong family history of colon.  Currently denies any alcohol use or illegal substances.  Fully active with husband.  Lives with husband of 21 years-William.  No children.  She enjoys swimming, going to flea market reports that she thinks that she eats a decently well-balanced diet she does not restrict any foods.  Drinks a pot or more of coffee daily.  Reports may be drinking 2-2 bottles of water daily.  Wears her seatbelt.  Does not wear sunscreen.  Has smoke detectors at home.  Does not use her phone while she is driving.  Currently she is smoking a pack a day.  Quit at this time.  Health maintenance: Needs to have vaccine in the coming months IV screening follow-up on tetanus, Is up-to-date on mammogram, 1 year follow-up was suggested back in July. Colonoscopy was performed in 2018 follow-up in 5 years.  Does not have any baseline labs currently in the system.   Today patient denies signs and symptoms of COVID 19 infection including fever, chills, cough, shortness of breath, and headache.  Past Medical, Surgical, Social History, Allergies, and Medications have been Reviewed.   Past Medical History:  Diagnosis Date  . Anemia    Past Surgical History:  Procedure Laterality Date  . COLONOSCOPY     Social History   Socioeconomic History  . Marital status: Married    Spouse name: Not on file  . Number of children: Not on file  . Years of education: Not on file  . Highest education level: Not on file  Occupational History  . Not on file  Social Needs  . Financial resource strain: Not on file  . Food insecurity    Worry: Not on file    Inability: Not on file  . Transportation needs   Medical: Not on file    Non-medical: Not on file  Tobacco Use  . Smoking status: Current Every Day Smoker    Packs/day: 1.00  . Smokeless tobacco: Never Used  Substance and Sexual Activity  . Alcohol use: No  . Drug use: No  . Sexual activity: Yes    Birth control/protection: None  Lifestyle  . Physical activity    Days per week: Not on file    Minutes per session: Not on file  . Stress: Not on file  Relationships  . Social Herbalist on phone: Not on file    Gets together: Not on file    Attends religious service: Not on file    Active member of club or organization: Not on file    Attends meetings of clubs or organizations: Not on file    Relationship status: Not on file  . Intimate partner violence    Fear of current or ex partner: Not on file    Emotionally abused: Not on file    Physically abused: Not on file    Forced sexual activity: Not on file  Other Topics Concern  . Not on file  Social History Narrative  . Not on file    Outpatient Encounter Medications as of 09/15/2018  Medication Sig  . naproxen (NAPROSYN) 375 MG tablet Take 1 tablet (375  mg total) by mouth 2 (two) times daily.  . [DISCONTINUED] traMADol (ULTRAM) 50 MG tablet Take 1 tablet (50 mg total) by mouth every 12 (twelve) hours as needed for severe pain.   No facility-administered encounter medications on file as of 09/15/2018.    No Known Allergies  Review of Systems  Constitutional: Negative for chills and fever.  HENT: Negative.   Eyes: Negative.  Negative for visual disturbance.  Respiratory: Negative.  Negative for cough and shortness of breath.   Cardiovascular: Negative.  Negative for chest pain, palpitations and leg swelling.  Gastrointestinal: Negative.   Endocrine: Negative.  Negative for polydipsia, polyphagia and polyuria.  Genitourinary: Negative.   Musculoskeletal: Negative.   Skin: Negative.   Allergic/Immunologic: Negative.   Neurological: Negative.  Negative for  dizziness and headaches.  Hematological: Negative.   Psychiatric/Behavioral: Negative.   All other systems reviewed and are negative.      Objective:     BP 110/72 (BP Location: Left Arm, Patient Position: Sitting)   Pulse 86   Temp 97.6 F (36.4 C) (Temporal)   Resp 15   Ht 5\' 7"  (1.702 m)   Wt 128 lb 1.9 oz (58.1 kg)   BMI 20.07 kg/m   Physical Exam Vitals signs and nursing note reviewed.  Constitutional:      Appearance: Normal appearance. She is normal weight.  HENT:     Head: Normocephalic and atraumatic.     Right Ear: External ear normal.     Left Ear: External ear normal.     Nose: Nose normal.  Eyes:     General:        Right eye: No discharge.        Left eye: No discharge.     Conjunctiva/sclera: Conjunctivae normal.  Neck:     Musculoskeletal: Normal range of motion and neck supple.  Cardiovascular:     Rate and Rhythm: Normal rate and regular rhythm.     Pulses: Normal pulses.     Heart sounds: Normal heart sounds.  Pulmonary:     Effort: Pulmonary effort is normal.     Breath sounds: Normal breath sounds.  Musculoskeletal: Normal range of motion.  Skin:    General: Skin is warm.     Capillary Refill: Capillary refill takes less than 2 seconds.  Neurological:     Mental Status: She is alert and oriented to person, place, and time.  Psychiatric:        Mood and Affect: Mood normal.        Behavior: Behavior normal.        Thought Content: Thought content normal.        Judgment: Judgment normal.        Assessment and Plan       1. Nicotine abuse Asked about quitting: confirms they are currently smokes cigarettes Advise to quit smoking: Educated about QUITTING to reduce the risk of cancer, cardio and cerebrovascular disease. Assess willingness: Unwilling to quit at this time, but is working on cutting back. Assist with counseling and pharmacotherapy: Counseled for 5 minutes and literature provided. Arrange for follow up:  not quitting follow  up in 3 months and continue to offer help.   2. Skin tag Would like skin tag removed under arm. Will set up the removal if the tag around her schedule  3. Caffeine use Drinks over a pot of coffee daily. Advised to reduce the amount of caffeine she drinks daily. Encouraged increase in water.   4.  Never uses sunscreen Reports not wearing sunscreen and enjoys swimming and being outside   Follow Up:     6 months for CPE and labs , 1 week for skin tag removal. Perlie Mayo, DNP, AGNP-BC [place], [address], [place] [address] Office Hours: Mon-Thurs 8 am-5 pm; Fri 8 am-12 pm Office Phone:  [phone]  Office Fax: [phone]

## 2018-09-15 NOTE — Patient Instructions (Signed)
    Thank you for coming into the office today. I appreciate the opportunity to provide you with the care for your health and wellness. Today we discussed: overall health  Follow up: 1 week for skin tag removal; 6 months for CPE   No labs today.  Work on smoking cessation. Wear sunscreen :)  Please continue to practice social distancing to keep you, your family, and our community safe.  If you must go out, please wear a Mask and practice good handwashing.  [place] YOUR HANDS WELL AND FREQUENTLY. AVOID TOUCHING YOUR FACE, UNLESS YOUR HANDS ARE FRESHLY WASHED.  GET FRESH AIR DAILY. STAY HYDRATED WITH WATER.   It was a pleasure to see you and I look forward to continuing to work together on your health and well-being. Please do not hesitate to call the office if you need care or have questions about your care.  Have a wonderful day and week. With Gratitude, Cherly Beach, DNP, AGNP-BC     Please think about quitting smoking.  This is very important for your health.  Consider setting a quit date, then cutting back or switching brands to prepare to stop.  Also think of the money you will save every day by not smoking.  Quick Tips to Quit Smoking: . Fix a date i.e. keep a date in mind from when you would not touch a tobacco product to smoke  . Keep yourself busy and block your mind with work loads or reading books or watching movies in malls where smoking is not allowed  . Vanish off the things which reminds you about smoking for example match box, or your favorite lighter, or the pipe you used for smoking, or your favorite jeans and shirt with which you used to enjoy smoking, or the club where you used to do smoking  . Try to avoid certain people places and incidences where and with whom smoking is a common factor to add on  . Praise yourself with some token gifts from the money you saved by stopping smoking  . Anti Smoking teams are there to help you. Join their programs  .  Anti-smoking Gums are there in many medical shops. Try them to quit smoking   Side-effects of Smoking: . Disease caused by smoking cigarettes are emphysema, bronchitis, heart failures  . Premature death  . Cancer is the major side effect of smoking  . Heart attacks and strokes are the quick effects of smoking causing sudden death  . Some smokers lives end up with limbs amputated  . Breathing problem or fast breathing is another side effect of smoking  . Due to more intakes of smokes, carbon mono-oxide goes into your brain and other muscles of the body which leads to swelling of the veins and blockage to the air passage to lungs  . Carbon monoxide blocks blood vessels which leads to blockage in the flow of blood to different major body organs like heart lungs and thus leads to attacks and deaths  . During pregnancy smoking is very harmful and leads to premature birth of the infant, spontaneous abortions, low weight of the infant during birth  . Fat depositions to narrow and blocked blood vessels causing heart attacks  . In many cases cigarette smoking caused infertility in men

## 2018-09-21 ENCOUNTER — Other Ambulatory Visit: Payer: Self-pay

## 2018-09-21 ENCOUNTER — Ambulatory Visit: Payer: 59 | Admitting: Family Medicine

## 2018-09-21 ENCOUNTER — Encounter: Payer: Self-pay | Admitting: Family Medicine

## 2018-09-21 VITALS — BP 120/58 | HR 80 | Temp 98.5°F | Resp 12 | Ht 67.0 in | Wt 127.1 lb

## 2018-09-21 DIAGNOSIS — L989 Disorder of the skin and subcutaneous tissue, unspecified: Secondary | ICD-10-CM

## 2018-09-21 DIAGNOSIS — L918 Other hypertrophic disorders of the skin: Secondary | ICD-10-CM

## 2018-09-21 NOTE — Patient Instructions (Signed)
    Thank you for coming into the office today. I appreciate the opportunity to provide you with the care for your health and wellness. Today we discussed: skin tag removal  Follow up as needed  Keep site of removal clean  Please continue to practice social distancing to keep you, your family, and our community safe.  If you must go out, please wear a Mask and practice good handwashing.  [place] YOUR HANDS WELL AND FREQUENTLY. AVOID TOUCHING YOUR FACE, UNLESS YOUR HANDS ARE FRESHLY WASHED.  GET FRESH AIR DAILY. STAY HYDRATED WITH WATER.   It was a pleasure to see you and I look forward to continuing to work together on your health and well-being. Please do not hesitate to call the office if you need care or have questions about your care.  Have a wonderful day and week.  With Gratitude,  Cherly Beach, DNP, AGNP-BC

## 2018-09-21 NOTE — Progress Notes (Signed)
Subjective:     Patient ID: Allison Carpenter, female   DOB: [birth year], 52 y.o.   MRN: [id number]  Allison Carpenter presents for skin tag removal  Skin tag under right arm. Less than 3 mm in diameter. Skin colored. Starting to rub and bother her. Here for removal today.  Today patient denies signs and symptoms of COVID 19 infection including fever, chills, cough, shortness of breath, and headache.  Past Medical, Surgical, Social History, Allergies, and Medications have been Reviewed.  Past Medical History:  Diagnosis Date  . Anemia    Past Surgical History:  Procedure Laterality Date  . COLONOSCOPY     Social History   Socioeconomic History  . Marital status: Married    Spouse name: Gwyndolyn Saxon   . Number of children: Not on file  . Years of education: Not on file  . Highest education level: High school graduate  Occupational History  . Not on file  Social Needs  . Financial resource strain: Not hard at all  . Food insecurity    Worry: Never true    Inability: Never true  . Transportation needs    Medical: No    Non-medical: No  Tobacco Use  . Smoking status: Current Every Day Smoker    Packs/day: 1.00  . Smokeless tobacco: Never Used  Substance and Sexual Activity  . Alcohol use: No  . Drug use: No  . Sexual activity: Yes    Birth control/protection: None  Lifestyle  . Physical activity    Days per week: 7 days    Minutes per session: 30 min  . Stress: Not at all  Relationships  . Social Herbalist on phone: Once a week    Gets together: Once a week    Attends religious service: 1 to 4 times per year    Active member of club or organization: Yes    Attends meetings of clubs or organizations: Never    Relationship status: Married  . Intimate partner violence    Fear of current or ex partner: No    Emotionally abused: No    Physically abused: No    Forced sexual activity: No  Other Topics Concern  . Not on file  Social History Narrative   Lives with Husband Gwyndolyn Saxon 21 years    No children      Mitzie Na, [place], [employer] and [place]      Enjoys swimming, flea markets, visit family   Enjoys watching sports      Diet: well balanced no diet    Caffeine: coffee 1 pot or more a day   Water: 2 bottles maybe 3 a day      Wear seat belt   Does not wear sunscreen   Smoke detectors    Does not use her phone while driving        Outpatient Encounter Medications as of 09/21/2018  Medication Sig  . [DISCONTINUED] naproxen (NAPROSYN) 375 MG tablet Take 1 tablet (375 mg total) by mouth 2 (two) times daily. (Patient not taking: Reported on 09/21/2018)   No facility-administered encounter medications on file as of 09/21/2018.    No Known Allergies  Review of Systems  Constitutional: Negative.   HENT: Negative.   Eyes: Negative.   Respiratory: Negative.   Cardiovascular: Negative.   Gastrointestinal: Negative.   Endocrine: Negative.   Genitourinary: Negative.   Musculoskeletal: Negative.   Skin: Negative.   Allergic/Immunologic: Negative.   Neurological: Negative.  Hematological: Negative.   Psychiatric/Behavioral: Negative.   All other systems reviewed and are negative.      Objective:     BP (!) 120/58   Pulse 80   Temp 98.5 F (36.9 C) (Temporal)   Resp 12   Ht 5\' 7"  (1.702 m)   Wt 127 lb 1.3 oz (57.6 kg)   SpO2 98%   BMI 19.90 kg/m   Physical Exam Vitals signs and nursing note reviewed.  Constitutional:      Appearance: Normal appearance.  Neck:     Musculoskeletal: Normal range of motion and neck supple.  Cardiovascular:     Rate and Rhythm: Normal rate and regular rhythm.     Pulses: Normal pulses.     Heart sounds: Normal heart sounds.  Pulmonary:     Effort: Pulmonary effort is normal.     Breath sounds: Normal breath sounds.  Musculoskeletal: Normal range of motion.  Skin:    General: Skin is cool.     Comments: Skin tag right axilla   Neurological:     General: No focal deficit  present.     Mental Status: She is alert and oriented to person, place, and time.  Psychiatric:        Mood and Affect: Mood normal.        Behavior: Behavior normal.        Thought Content: Thought content normal.        Judgment: Judgment normal.    Procedure: Skin removal-Right axilla. Site was cleaned with alcohol prep pad. Scalp was used to removal tag. Scant bleeding noted, Bactroban was applied with gauze and tape. Advised to keep clean.      Assessment and Plan       1. Encounter for removal of skin lesion Procedure: Skin removal-Right axilla. Site was cleaned with alcohol prep pad. Scalp was used to removal tag. Scant bleeding noted, Bactroban was applied with gauze and tape. Advised to keep clean.   2. Skin tag Removed see above  F/u PRN  Perlie Mayo, DNP, AGNP-BC [place], [address] [place], [place] [address] Office Hours: Mon-Thurs 8 am-5 pm; Fri 8 am-12 pm Office Phone:  [phone]  Office Fax: [phone]

## 2018-10-20 ENCOUNTER — Ambulatory Visit: Payer: 59 | Admitting: Orthopaedic Surgery

## 2018-11-03 ENCOUNTER — Ambulatory Visit: Payer: 59 | Admitting: Orthopaedic Surgery

## 2018-11-03 ENCOUNTER — Other Ambulatory Visit: Payer: Self-pay

## 2018-11-14 DIAGNOSIS — H9192 Unspecified hearing loss, left ear: Secondary | ICD-10-CM

## 2018-11-14 DIAGNOSIS — J310 Chronic rhinitis: Secondary | ICD-10-CM | POA: Insufficient documentation

## 2018-11-14 HISTORY — DX: Unspecified hearing loss, left ear: H91.92

## 2019-03-29 ENCOUNTER — Encounter: Payer: 59 | Admitting: Family Medicine

## 2019-04-18 ENCOUNTER — Encounter: Payer: 59 | Admitting: Family Medicine

## 2019-04-19 ENCOUNTER — Encounter: Payer: 59 | Admitting: Family Medicine

## 2019-05-03 ENCOUNTER — Other Ambulatory Visit: Payer: Self-pay

## 2019-05-03 ENCOUNTER — Encounter: Payer: Self-pay | Admitting: Family Medicine

## 2019-05-03 ENCOUNTER — Ambulatory Visit: Payer: 59 | Admitting: Family Medicine

## 2019-05-03 VITALS — BP 96/60 | HR 81 | Temp 97.4°F | Resp 15 | Ht 67.0 in | Wt 127.1 lb

## 2019-05-03 DIAGNOSIS — Z720 Tobacco use: Secondary | ICD-10-CM

## 2019-05-03 DIAGNOSIS — Z124 Encounter for screening for malignant neoplasm of cervix: Secondary | ICD-10-CM | POA: Diagnosis not present

## 2019-05-03 DIAGNOSIS — D229 Melanocytic nevi, unspecified: Secondary | ICD-10-CM | POA: Insufficient documentation

## 2019-05-03 DIAGNOSIS — Z0001 Encounter for general adult medical examination with abnormal findings: Secondary | ICD-10-CM

## 2019-05-03 NOTE — Patient Instructions (Addendum)
I appreciate the opportunity to provide you with care for your health and wellness. Today we discussed: annual visit, overall health   Follow up: 12 months annual CPE no pap    Labs placed today-fasting  Referrals today for dermatology and pap smear needs  Please consider stopping smoking :)   Please continue to practice social distancing to keep you, your family, and our community safe.  If you must go out, please wear a mask and practice good handwashing.  It was a pleasure to see you and I look forward to continuing to work together on your health and well-being. Please do not hesitate to call the office if you need care or have questions about your care.  Have a wonderful day and week. With Gratitude, Cherly Beach, DNP, AGNP-BC  HEALTH MAINTENANCE RECOMMENDATIONS:  It is recommended that you get at least 30 minutes of aerobic exercise at least 5 days/week (for weight loss, you may need as much as 60-90 minutes). This can be any activity that gets your heart rate up. This can be divided in 10-15 minute intervals if needed, but try and build up your endurance at least once a week.  Weight bearing exercise is also recommended twice weekly.  Eat a healthy diet with lots of vegetables, fruits and fiber.  "Colorful" foods have a lot of vitamins (ie green vegetables, tomatoes, red peppers, etc).  Limit sweet tea, regular sodas and alcoholic beverages, all of which has a lot of calories and sugar.  Up to 1 alcoholic drink daily may be beneficial for women (unless trying to lose weight, watch sugars).  Drink a lot of water.  Calcium recommendations are 1200-1500 mg daily (1500 mg for postmenopausal women or women without ovaries), and vitamin D 1000 IU daily.  This should be obtained from diet and/or supplements (vitamins), and calcium should not be taken all at once, but in divided doses.  Monthly self breast exams and yearly mammograms for women over the age of 58 is  recommended.  Sunscreen of at least SPF 30 should be used on all sun-exposed parts of the skin when outside between the hours of 10 am and 4 pm (not just when at beach or pool, but even with exercise, golf, tennis, and yard work!)  Use a sunscreen that says "broad spectrum" so it covers both UVA and UVB rays, and make sure to reapply every 1-2 hours.  Remember to change the batteries in your smoke detectors when changing your clock times in the spring and fall.  Use your seat belt every time you are in a car, and please drive safely and not be distracted with cell phones and texting while driving.

## 2019-05-03 NOTE — Assessment & Plan Note (Signed)
Discussed monthly self breast exams and yearly mammograms; at least 30 minutes of aerobic activity at least 5 days/week and weight-bearing exercise 2x/week; proper sunscreen use reviewed; healthy diet, including goals of calcium and vitamin D intake and alcohol recommendations (less than or equal to 1 drink/day) reviewed; regular seatbelt use; changing batteries in smoke detectors.  Immunization recommendations discussed.  Colonoscopy recommendations reviewed.  

## 2019-05-03 NOTE — Progress Notes (Signed)
Health Maintenance reviewed -   There is no immunization history on file for this patient. Last Pap smear: needs referral  Last mammogram: good up to date  Last colonoscopy: July 2021 Last DEXA: n/a Dentist: cleaning twice yearly  Ophtho: glasses good-new  Exercise: Walk at work daily at gym    Other doctors caring for patient include:  Patient Care Team: Jerelene Redden, Barbee Cough, NP as PCP - General (Family Medicine)  End of Life Discussion:  Patient does not have a living will and medical power of attorney    Code Status: Not on file   Subjective:   HPI  Allison Carpenter is a 54 y.o. female who presents for annual wellness visit and follow-up on chronic medical conditions.  She has the following concerns: only would like to have a mole/skin tag removed as necklace gets caught on it. Otherwise still smoking about a pack a day. Does not have desire to quit. Only eats one meal a day. No changes in bowel or bladder habits. No falls. No memory changes. No sleep trouble. Eating well the meal she does eat. Denies any other skin issues.   Review Of Systems  Review of Systems  Constitutional: Negative.   HENT: Negative.   Eyes: Negative.   Respiratory: Negative.   Cardiovascular: Negative.   Gastrointestinal: Negative.   Endocrine: Negative.   Genitourinary: Negative.   Musculoskeletal: Negative.   Skin: Negative.        Mole   Allergic/Immunologic: Negative.   Neurological: Negative.   Hematological: Negative.   Psychiatric/Behavioral: Negative.   All other systems reviewed and are negative.   Objective:   PHYSICAL EXAM:  BP 96/60   Pulse 81   Temp (!) 97.4 F (36.3 C) (Temporal)   Resp 15   Ht 5\' 7"  (1.702 m)   Wt 127 lb 1.3 oz (57.6 kg)   SpO2 97%   BMI 19.90 kg/m   Physical Exam Vitals and nursing note reviewed.  Constitutional:      Appearance: Normal appearance. She is normal weight.  HENT:     Head: Normocephalic.     Right Ear: Ear canal and  external ear normal.     Left Ear: Ear canal and external ear normal.     Mouth/Throat:     Comments: Mask in place  Eyes:     Extraocular Movements: Extraocular movements intact.     Conjunctiva/sclera: Conjunctivae normal.     Pupils: Pupils are equal, round, and reactive to light.  Cardiovascular:     Rate and Rhythm: Normal rate and regular rhythm.     Pulses: Normal pulses.          Radial pulses are 2+ on the right side and 2+ on the left side.       Dorsalis pedis pulses are 2+ on the right side and 2+ on the left side.     Heart sounds: Normal heart sounds.  Pulmonary:     Effort: Pulmonary effort is normal.     Breath sounds: Normal breath sounds.  Abdominal:     General: Abdomen is flat. Bowel sounds are normal.     Palpations: Abdomen is soft.  Musculoskeletal:        General: Normal range of motion.     Cervical back: Normal range of motion and neck supple.     Right lower leg: No edema.     Left lower leg: No edema.  Skin:    General: Skin  is warm and dry.     Capillary Refill: Capillary refill takes less than 2 seconds.     Comments: Two nevi at right base of neck between collar bone and top of shoulder.   Neurological:     General: No focal deficit present.     Mental Status: She is alert and oriented to person, place, and time. Mental status is at baseline.     Cranial Nerves: Cranial nerves are intact.     Sensory: Sensation is intact.     Motor: Motor function is intact.     Gait: Gait is intact.     Deep Tendon Reflexes: Reflexes are normal and symmetric.  Psychiatric:        Attention and Perception: Attention normal.        Mood and Affect: Mood normal.        Behavior: Behavior normal.        Thought Content: Thought content normal.        Judgment: Judgment normal.    Depression Screening  Depression screen [facility] 2/9 05/03/2019 09/21/2018 09/15/2018  Decreased Interest 0 0 0  Down, Depressed, Hopeless 0 0 0  PHQ - 2 Score 0 0 0      Assessment &  Plan:   1. Annual visit for general adult medical examination with abnormal findings   2. Encounter for screening for malignant neoplasm of cervix   3. Nicotine abuse   4. Multiple nevi     Tests ordered Orders Placed This Encounter  Procedures  . CBC  . COMPLETE METABOLIC PANEL WITH GFR  . Hemoglobin A1c  . Lipid panel  . VITAMIN D 25 Hydroxy (Vit-D Deficiency, Fractures)  . TSH  . Ambulatory referral to Obstetrics / Gynecology  . Ambulatory referral to Dermatology    Plan: Please see assessment and plan per problem list above.   No orders of the defined types were placed in this encounter.   The patient's weight, height, BMI, and visual acuity have been recorded in the chart.  I have made referrals, counseling, and provided education to the patient based on review of the above and I have provided the patient with a written personalized care plan for preventive services.     Perlie Mayo, NP   05/03/2019

## 2019-05-03 NOTE — Assessment & Plan Note (Signed)
Asked about quitting: confirms they are currently smokes cigarettes Advise to quit smoking: Educated about QUITTING to reduce the risk of cancer, cardio and cerebrovascular disease. Assess willingness: Unwilling to quit at this time, but is working on cutting back. Assist with counseling and pharmacotherapy: Counseled for 5 minutes and literature provided. Arrange for follow up:  not quitting follow up in 3 months and continue to offer help.   

## 2019-05-03 NOTE — Assessment & Plan Note (Signed)
Pap smear needs-gyn referral

## 2019-05-03 NOTE — Assessment & Plan Note (Signed)
Two nevi/moles she would like removed, I would prefer dermatology do this incase a stitch is needed. She agrees. Referral placed.

## 2019-05-04 LAB — CBC
HCT: 42 % (ref 35.0–45.0)
Hemoglobin: 14.1 g/dL (ref 11.7–15.5)
MCH: 30.1 pg (ref 27.0–33.0)
MCHC: 33.6 g/dL (ref 32.0–36.0)
MCV: 89.7 fL (ref 80.0–100.0)
MPV: 10.3 fL (ref 7.5–12.5)
Platelets: 267 10*3/uL (ref 140–400)
RBC: 4.68 10*6/uL (ref 3.80–5.10)
RDW: 12.2 % (ref 11.0–15.0)
WBC: 5 10*3/uL (ref 3.8–10.8)

## 2019-05-04 LAB — LIPID PANEL
Cholesterol: 234 mg/dL — ABNORMAL HIGH (ref <200)
HDL: 50 mg/dL (ref >=50)
LDL Cholesterol (Calc): 164 mg/dL (calc) — ABNORMAL HIGH
Non-HDL Cholesterol (Calc): 184 mg/dL (calc) — ABNORMAL HIGH (ref <130)
Total CHOL/HDL Ratio: 4.7 (calc) (ref <5.0)
Triglycerides: 90 mg/dL (ref <150)

## 2019-05-04 LAB — TSH: TSH: 1.22 mIU/L

## 2019-05-04 LAB — COMPLETE METABOLIC PANEL WITH GFR
AG Ratio: 1.7 (calc) (ref 1.0–2.5)
ALT: 64 U/L — ABNORMAL HIGH (ref 6–29)
AST: 57 U/L — ABNORMAL HIGH (ref 10–35)
Albumin: 4.2 g/dL (ref 3.6–5.1)
Alkaline phosphatase (APISO): 69 U/L (ref 37–153)
BUN: 8 mg/dL (ref 7–25)
CO2: 30 mmol/L (ref 20–32)
Calcium: 9.6 mg/dL (ref 8.6–10.4)
Chloride: 102 mmol/L (ref 98–110)
Creat: 0.67 mg/dL (ref 0.50–1.05)
GFR, Est African American: 116 mL/min/{1.73_m2} (ref >=60)
GFR, Est Non African American: 100 mL/min/{1.73_m2} (ref >=60)
Globulin: 2.5 g/dL (calc) (ref 1.9–3.7)
Glucose, Bld: 86 mg/dL (ref 65–99)
Potassium: 4.6 mmol/L (ref 3.5–5.3)
Sodium: 140 mmol/L (ref 135–146)
Total Bilirubin: 0.4 mg/dL (ref 0.2–1.2)
Total Protein: 6.7 g/dL (ref 6.1–8.1)

## 2019-05-04 LAB — HEMOGLOBIN A1C
Hgb A1c MFr Bld: 5.3 % of total Hgb (ref <5.7)
Mean Plasma Glucose: 105 (calc)
eAG (mmol/L): 5.8 (calc)

## 2019-05-04 LAB — VITAMIN D 25 HYDROXY (VIT D DEFICIENCY, FRACTURES): Vit D, 25-Hydroxy: 35 ng/mL (ref 30–100)

## 2019-05-05 ENCOUNTER — Other Ambulatory Visit: Payer: Self-pay

## 2019-05-05 DIAGNOSIS — R748 Abnormal levels of other serum enzymes: Secondary | ICD-10-CM

## 2019-05-08 LAB — COMPLETE METABOLIC PANEL WITH GFR
AG Ratio: 1.5 (calc) (ref 1.0–2.5)
ALT: 65 U/L — ABNORMAL HIGH (ref 6–29)
AST: 64 U/L — ABNORMAL HIGH (ref 10–35)
Albumin: 3.7 g/dL (ref 3.6–5.1)
Alkaline phosphatase (APISO): 65 U/L (ref 37–153)
BUN: 11 mg/dL (ref 7–25)
CO2: 29 mmol/L (ref 20–32)
Calcium: 9.1 mg/dL (ref 8.6–10.4)
Chloride: 104 mmol/L (ref 98–110)
Creat: 0.65 mg/dL (ref 0.50–1.05)
GFR, Est African American: 117 mL/min/{1.73_m2} (ref >=60)
GFR, Est Non African American: 101 mL/min/{1.73_m2} (ref >=60)
Globulin: 2.4 g/dL (calc) (ref 1.9–3.7)
Glucose, Bld: 88 mg/dL (ref 65–99)
Potassium: 4 mmol/L (ref 3.5–5.3)
Sodium: 140 mmol/L (ref 135–146)
Total Bilirubin: 0.4 mg/dL (ref 0.2–1.2)
Total Protein: 6.1 g/dL (ref 6.1–8.1)

## 2019-05-09 NOTE — Addendum Note (Signed)
Addended by: Eual Fines on: 05/09/2019 01:54 PM   Modules accepted: Orders

## 2019-05-30 ENCOUNTER — Encounter: Payer: 59 | Admitting: Adult Health

## 2019-06-14 ENCOUNTER — Encounter: Payer: 59 | Admitting: Adult Health

## 2019-08-14 ENCOUNTER — Encounter: Payer: Self-pay | Admitting: Gastroenterology

## 2020-05-07 ENCOUNTER — Encounter: Payer: 59 | Admitting: Family Medicine

## 2020-07-11 IMAGING — DX RIGHT FOOT COMPLETE - 3+ VIEW
3 series · 3 of 3 positions shown · non-contrast
Comparison: None.

CLINICAL DATA: Status post fall down the stairs

EXAM:
RIGHT FOOT COMPLETE - 3+ VIEW

[foot ap]
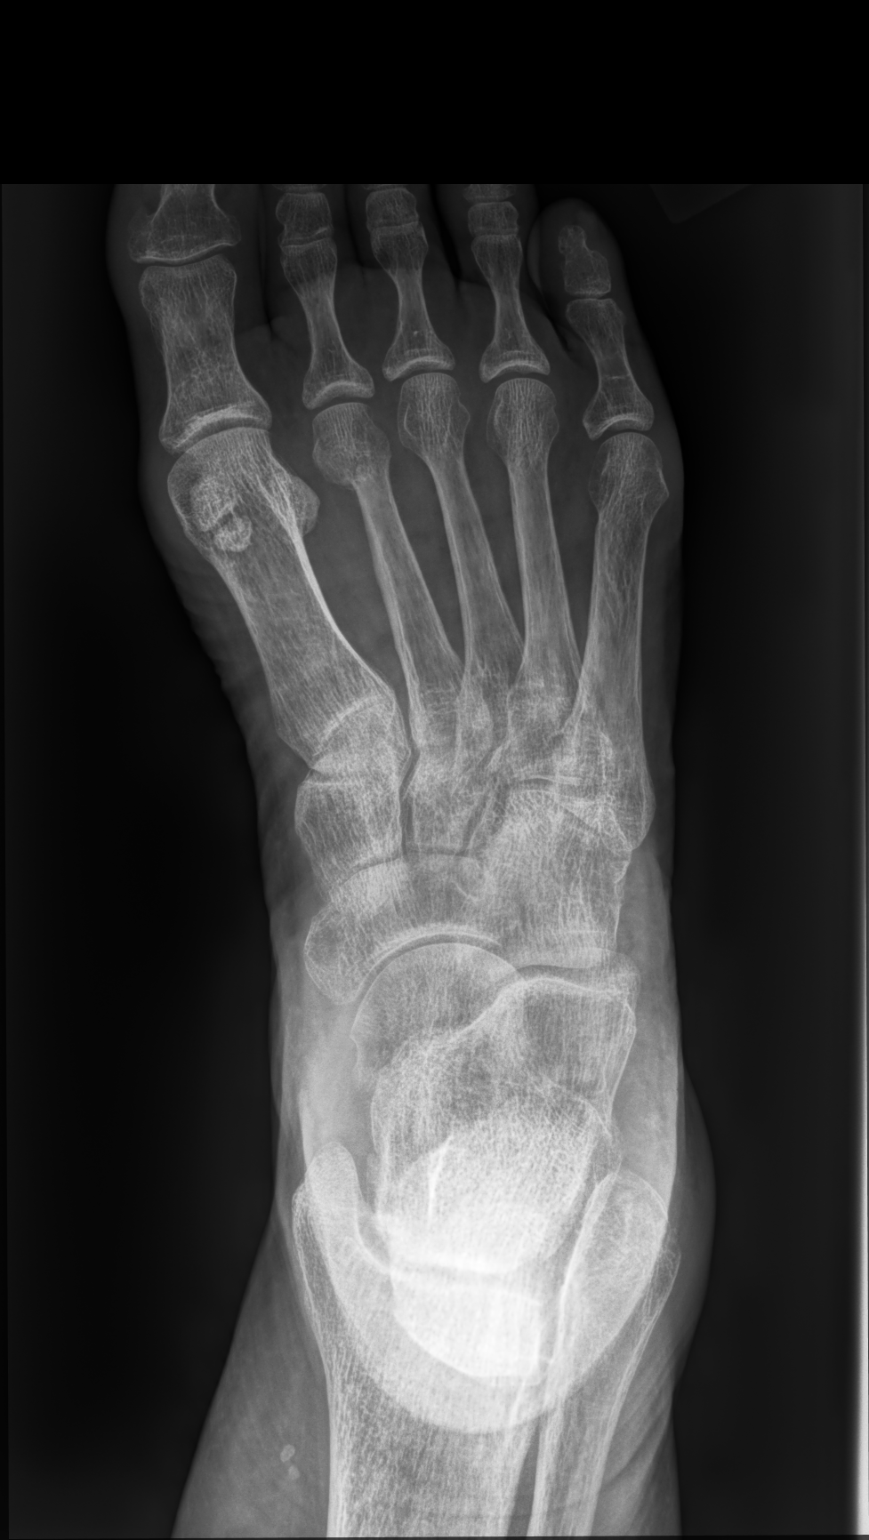

[foot mlo]
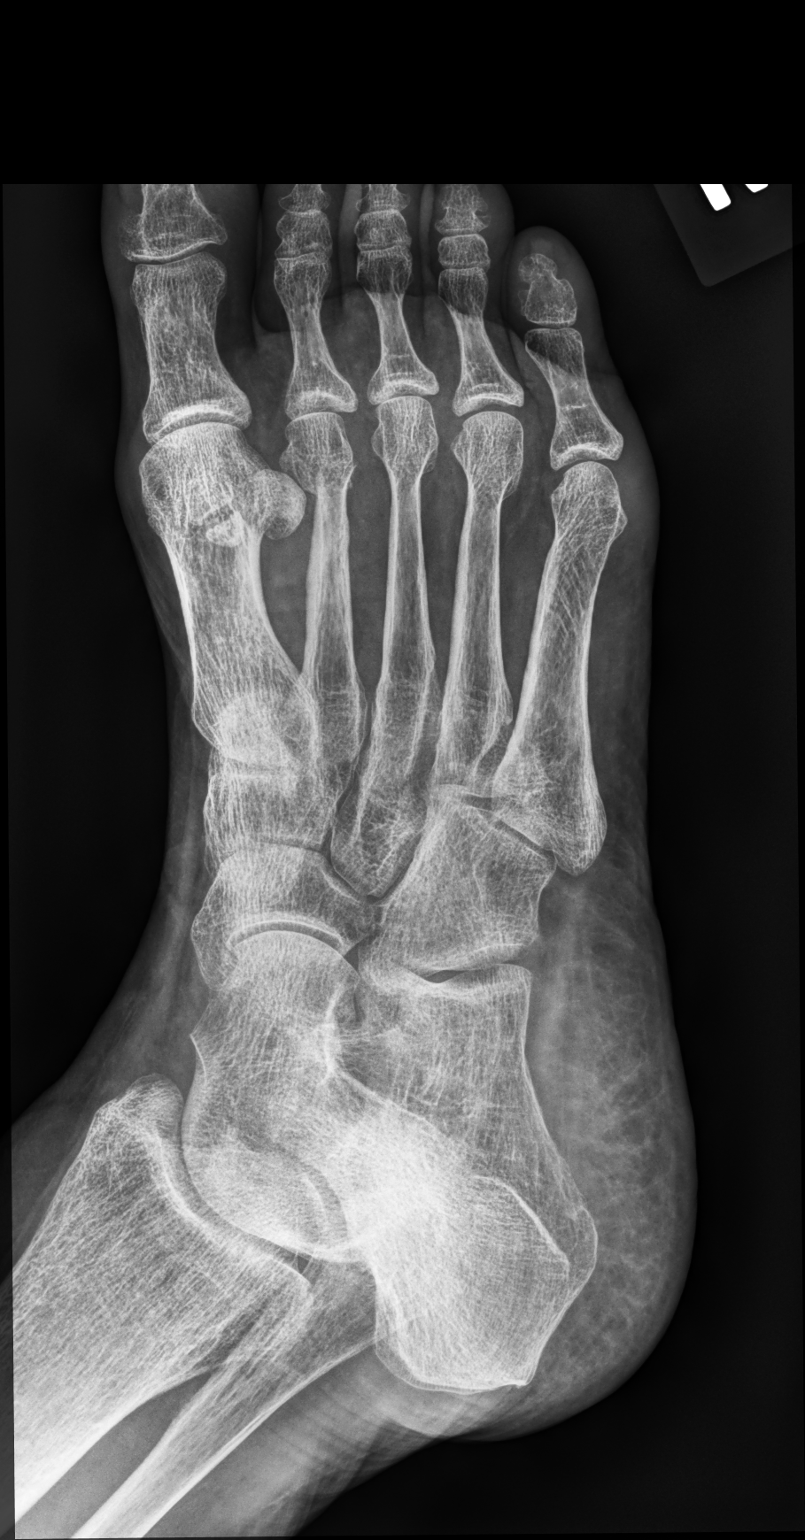

[foot lat]
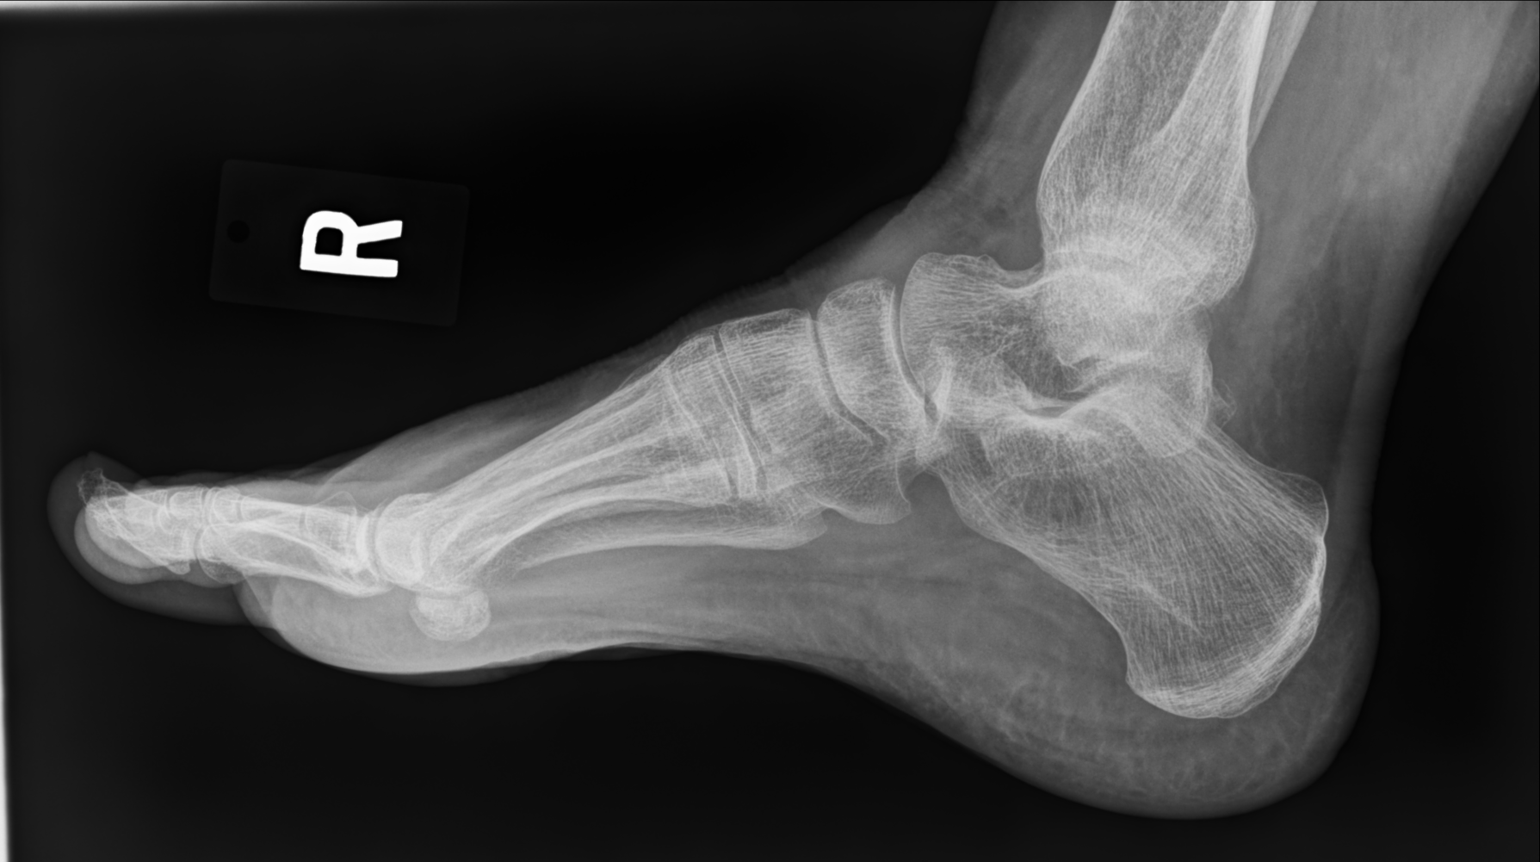

[3 of 3 positions shown; findings below may reference images not displayed]

FINDINGS: Generalized osteopenia. Acute nondisplaced fracture of the right
second metatarsal neck. Not well-visualized acute nondisplaced
fracture of the lateral malleolus.

No other fracture or dislocation. Overall alignment is anatomic.
Soft tissue swelling along the dorsal aspect of the forefoot.
IMPRESSION: Acute nondisplaced fracture of the right second metatarsal neck.

Not well-visualized acute nondisplaced fracture of the lateral
malleolus.

## 2020-07-11 IMAGING — DX RIGHT ANKLE - COMPLETE 3+ VIEW
3 series · 3 of 3 positions shown · non-contrast
Comparison: None.

CLINICAL DATA: Fall down stairs 3 weeks ago with persistent ankle
pain, initial encounter

EXAM:
RIGHT ANKLE - COMPLETE 3+ VIEW

[ankle ap]
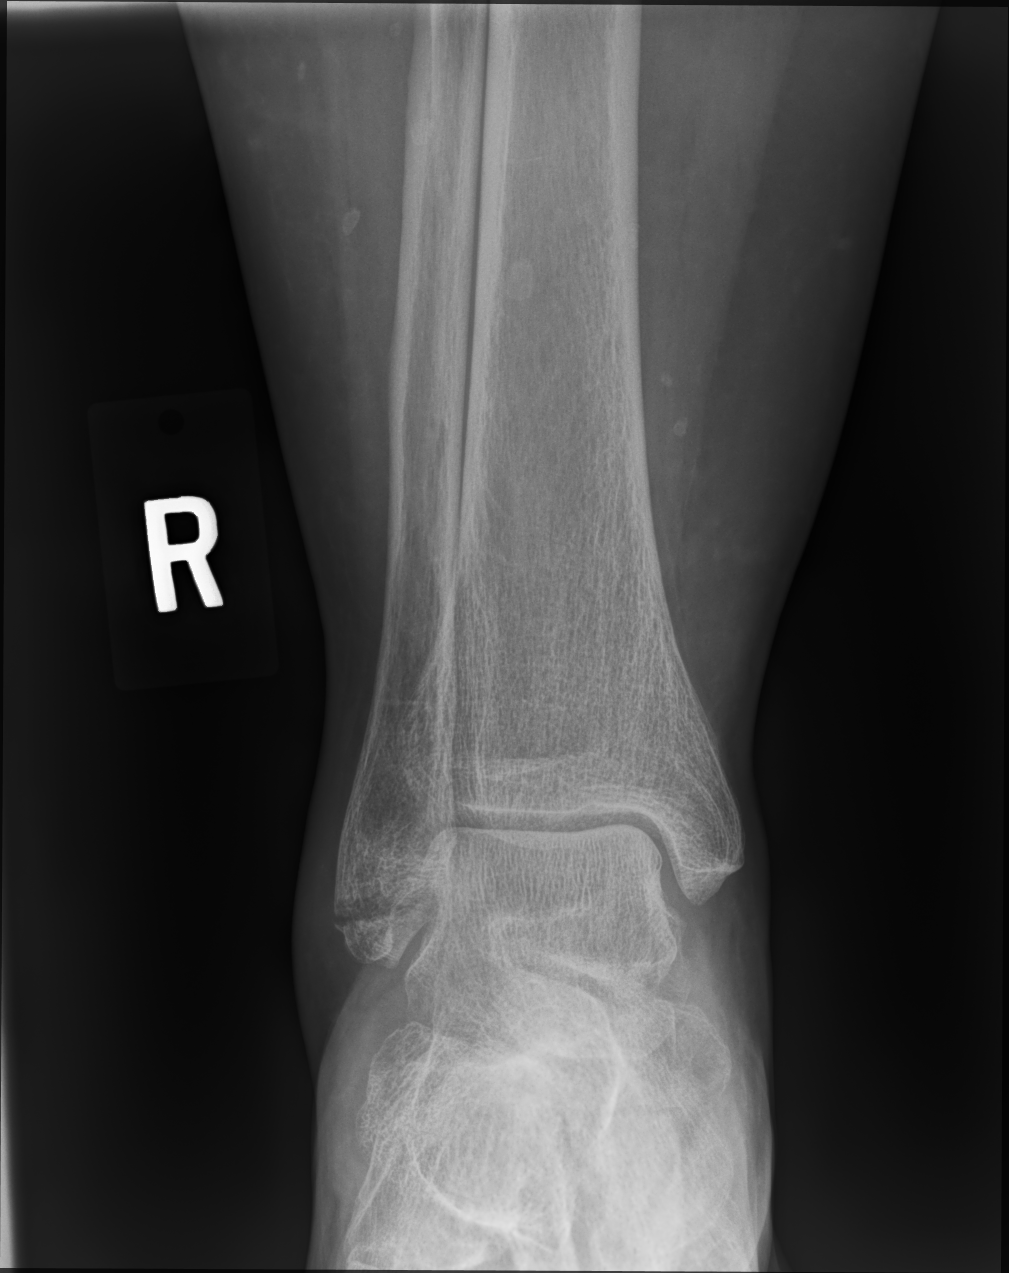

[ankle mlo]
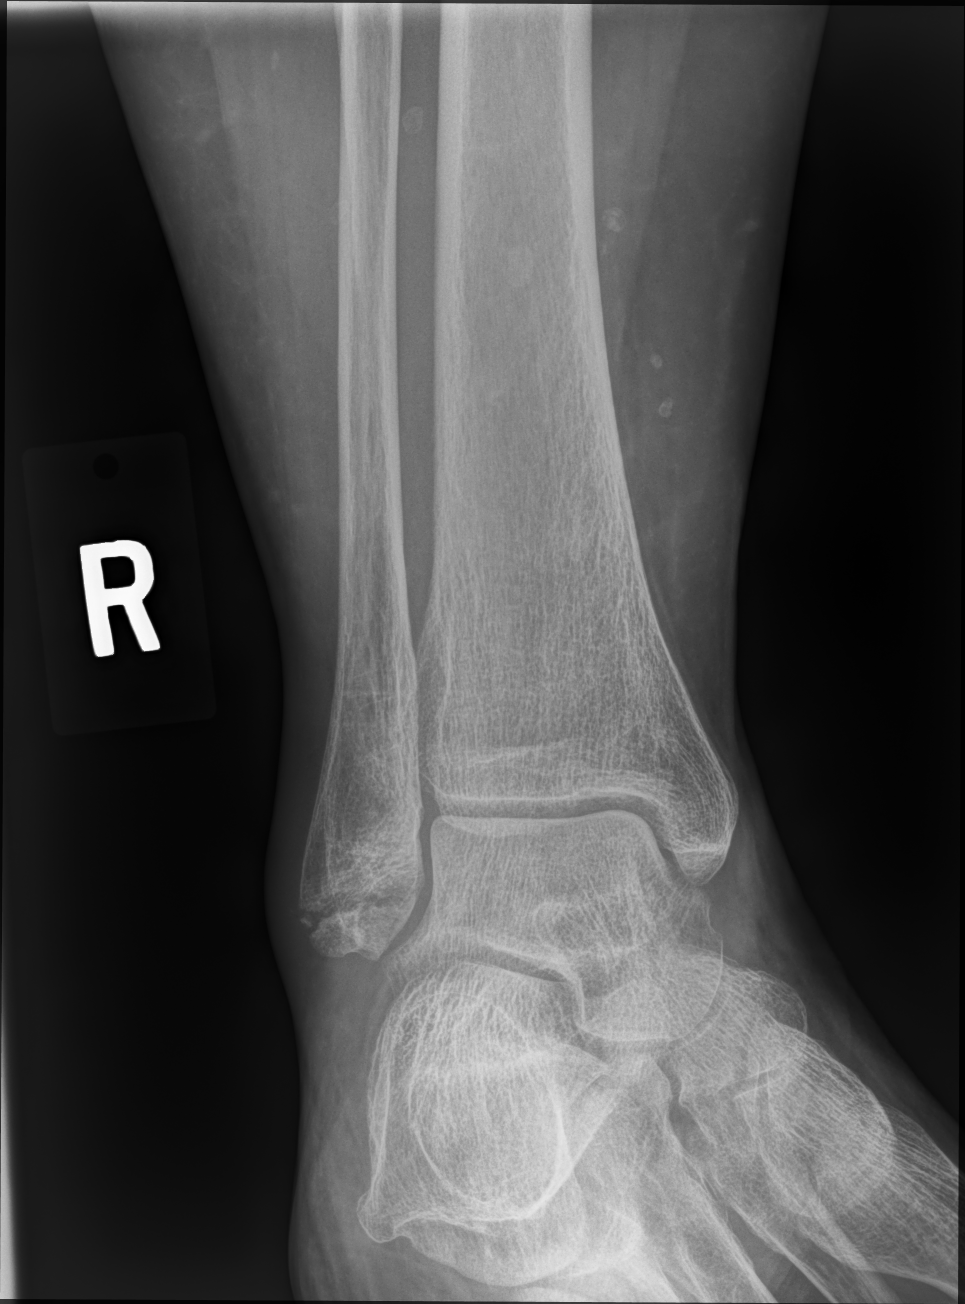

[ankle lat]
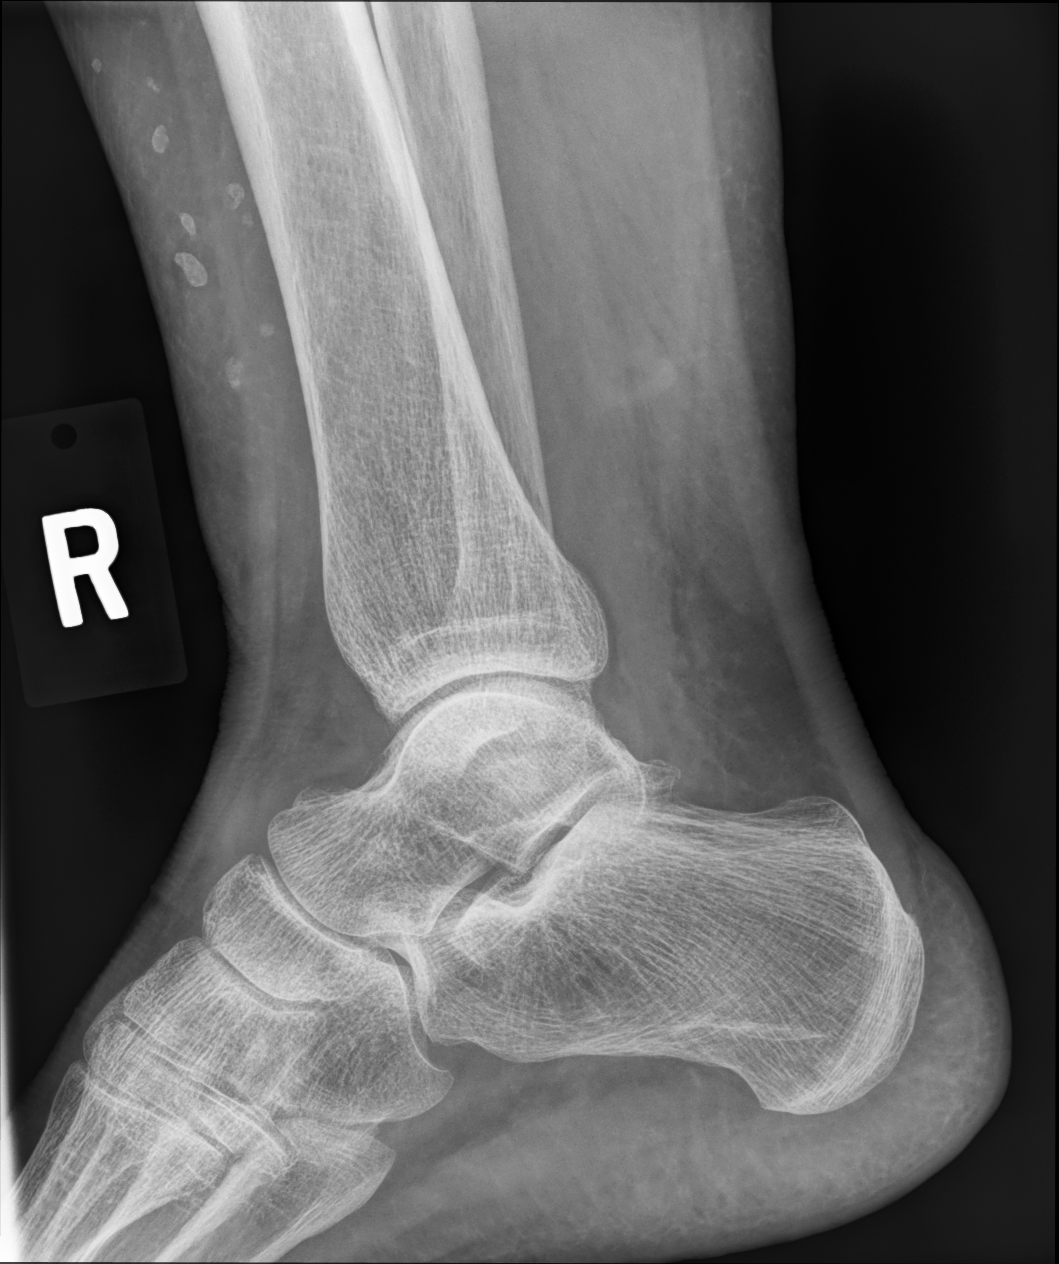

[3 of 3 positions shown; findings below may reference images not displayed]

FINDINGS: Transverse fracture through the distal tip fibula is noted with mild
displacement. A small adjacent fracture is noted within the fracture
line. Distal tibia is within normal limits. Tarsal bones are
unremarkable.
IMPRESSION: Mildly displaced comminuted distal right fibular fracture.

## 2022-08-13 ENCOUNTER — Encounter: Payer: Self-pay | Admitting: Emergency Medicine

## 2022-08-13 ENCOUNTER — Ambulatory Visit: Admission: EM | Admit: 2022-08-13 | Discharge: 2022-08-13 | Discharge status: Against Medical Advice | Payer: 59

## 2022-08-13 NOTE — ED Triage Notes (Signed)
States she has a plantar wart on bottom left foot for several weeks.  States has tried freezing medication

## 2022-08-21 ENCOUNTER — Ambulatory Visit: Payer: 59 | Admitting: Podiatry

## 2022-08-25 ENCOUNTER — Ambulatory Visit: Payer: 59 | Admitting: Internal Medicine

## 2022-09-01 ENCOUNTER — Ambulatory Visit: Payer: 59 | Admitting: Internal Medicine
